# Patient Record
Sex: Female | Born: 1950 | Race: White | Hispanic: No | Marital: Married | Smoking: Former smoker
Health system: Southern US, Community
[De-identification: ages and names within clinical notes are randomized; demographics above are authoritative.]

## PROBLEM LIST (undated history)

## (undated) DIAGNOSIS — M542 Cervicalgia: Secondary | ICD-10-CM

## (undated) DIAGNOSIS — I10 Essential (primary) hypertension: Secondary | ICD-10-CM

## (undated) DIAGNOSIS — K589 Irritable bowel syndrome without diarrhea: Secondary | ICD-10-CM

## (undated) DIAGNOSIS — M1990 Unspecified osteoarthritis, unspecified site: Secondary | ICD-10-CM

## (undated) HISTORY — PX: BREAST EXCISIONAL BIOPSY: SUR124

## (undated) HISTORY — DX: Essential (primary) hypertension: I10

## (undated) HISTORY — DX: Cervicalgia: M54.2

## (undated) HISTORY — PX: BREAST BIOPSY: SHX20

## (undated) HISTORY — DX: Unspecified osteoarthritis, unspecified site: M19.90

## (undated) HISTORY — DX: Irritable bowel syndrome, unspecified: K58.9

---

## 1998-04-10 ENCOUNTER — Other Ambulatory Visit: Admission: RE | Admit: 1998-04-10 | Discharge: 1998-04-10 | Payer: Self-pay | Admitting: Obstetrics & Gynecology

## 1999-12-08 ENCOUNTER — Encounter: Admission: RE | Admit: 1999-12-08 | Discharge: 1999-12-08 | Payer: Self-pay | Admitting: Obstetrics & Gynecology

## 1999-12-08 ENCOUNTER — Encounter: Payer: Self-pay | Admitting: Obstetrics & Gynecology

## 2001-07-26 ENCOUNTER — Encounter: Payer: Self-pay | Admitting: Obstetrics & Gynecology

## 2001-07-26 ENCOUNTER — Encounter: Admission: RE | Admit: 2001-07-26 | Discharge: 2001-07-26 | Payer: Self-pay | Admitting: Obstetrics & Gynecology

## 2001-08-17 ENCOUNTER — Other Ambulatory Visit: Admission: RE | Admit: 2001-08-17 | Discharge: 2001-08-17 | Payer: Self-pay | Admitting: Obstetrics & Gynecology

## 2004-08-27 ENCOUNTER — Ambulatory Visit: Payer: Self-pay | Admitting: Adult Health

## 2004-11-18 ENCOUNTER — Ambulatory Visit: Payer: Self-pay | Admitting: Pulmonary Disease

## 2004-12-25 ENCOUNTER — Ambulatory Visit: Payer: Self-pay | Admitting: Pulmonary Disease

## 2005-06-24 ENCOUNTER — Ambulatory Visit: Payer: Self-pay | Admitting: Pulmonary Disease

## 2005-07-15 ENCOUNTER — Ambulatory Visit: Payer: Self-pay | Admitting: Pulmonary Disease

## 2005-08-26 ENCOUNTER — Ambulatory Visit: Payer: Self-pay | Admitting: Pulmonary Disease

## 2005-10-26 ENCOUNTER — Other Ambulatory Visit: Admission: RE | Admit: 2005-10-26 | Discharge: 2005-10-26 | Payer: Self-pay | Admitting: Obstetrics & Gynecology

## 2006-07-13 ENCOUNTER — Ambulatory Visit: Payer: Self-pay | Admitting: Pulmonary Disease

## 2007-01-17 ENCOUNTER — Ambulatory Visit: Payer: Self-pay | Admitting: Pulmonary Disease

## 2007-01-17 LAB — CONVERTED CEMR LAB
ALT: 14 units/L (ref 0–40)
AST: 23 units/L (ref 0–37)
Albumin: 4.2 g/dL (ref 3.5–5.2)
Alkaline Phosphatase: 47 units/L (ref 39–117)
BUN: 14 mg/dL (ref 6–23)
Basophils Absolute: 0 10*3/uL (ref 0.0–0.1)
Basophils Relative: 0.4 % (ref 0.0–1.0)
Bilirubin, Direct: 0.1 mg/dL (ref 0.0–0.3)
CO2: 33 meq/L — ABNORMAL HIGH (ref 19–32)
Calcium: 9.3 mg/dL (ref 8.4–10.5)
Chloride: 103 meq/L (ref 96–112)
Creatinine, Ser: 0.8 mg/dL (ref 0.4–1.2)
Eosinophils Absolute: 0.2 10*3/uL (ref 0.0–0.6)
Eosinophils Relative: 2.1 % (ref 0.0–5.0)
GFR calc Af Amer: 95 mL/min
GFR calc non Af Amer: 79 mL/min
Glucose, Bld: 88 mg/dL (ref 70–99)
HCT: 38.9 % (ref 36.0–46.0)
Hemoglobin: 13.5 g/dL (ref 12.0–15.0)
Lymphocytes Relative: 42 % (ref 12.0–46.0)
MCHC: 34.6 g/dL (ref 30.0–36.0)
MCV: 92.7 fL (ref 78.0–100.0)
Monocytes Absolute: 0.4 10*3/uL (ref 0.2–0.7)
Monocytes Relative: 5.5 % (ref 3.0–11.0)
Neutro Abs: 3.6 10*3/uL (ref 1.4–7.7)
Neutrophils Relative %: 50 % (ref 43.0–77.0)
Platelets: 227 10*3/uL (ref 150–400)
Potassium: 3.9 meq/L (ref 3.5–5.1)
RBC: 4.2 M/uL (ref 3.87–5.11)
RDW: 12.2 % (ref 11.5–14.6)
Sed Rate: 11 mm/hr (ref 0–25)
Sodium: 140 meq/L (ref 135–145)
TSH: 1 microintl units/mL (ref 0.35–5.50)
Total Bilirubin: 0.9 mg/dL (ref 0.3–1.2)
Total Protein: 7.2 g/dL (ref 6.0–8.3)
WBC: 7.2 10*3/uL (ref 4.5–10.5)

## 2007-03-24 ENCOUNTER — Encounter: Admission: RE | Admit: 2007-03-24 | Discharge: 2007-03-24 | Payer: Self-pay | Admitting: Orthopaedic Surgery

## 2007-07-26 ENCOUNTER — Ambulatory Visit: Payer: Self-pay | Admitting: Pulmonary Disease

## 2007-08-02 ENCOUNTER — Ambulatory Visit: Payer: Self-pay | Admitting: Pulmonary Disease

## 2007-08-02 DIAGNOSIS — I10 Essential (primary) hypertension: Secondary | ICD-10-CM | POA: Insufficient documentation

## 2007-08-02 DIAGNOSIS — J309 Allergic rhinitis, unspecified: Secondary | ICD-10-CM | POA: Insufficient documentation

## 2007-09-22 HISTORY — PX: OTHER SURGICAL HISTORY: SHX169

## 2007-12-27 ENCOUNTER — Ambulatory Visit: Payer: Self-pay | Admitting: Pulmonary Disease

## 2008-02-16 ENCOUNTER — Telehealth (INDEPENDENT_AMBULATORY_CARE_PROVIDER_SITE_OTHER): Payer: Self-pay | Admitting: *Deleted

## 2008-02-20 ENCOUNTER — Ambulatory Visit: Payer: Self-pay | Admitting: Pulmonary Disease

## 2008-04-11 ENCOUNTER — Ambulatory Visit: Payer: Self-pay | Admitting: Pulmonary Disease

## 2008-04-11 DIAGNOSIS — M1990 Unspecified osteoarthritis, unspecified site: Secondary | ICD-10-CM | POA: Insufficient documentation

## 2008-04-29 DIAGNOSIS — J45909 Unspecified asthma, uncomplicated: Secondary | ICD-10-CM | POA: Insufficient documentation

## 2008-04-29 DIAGNOSIS — M542 Cervicalgia: Secondary | ICD-10-CM | POA: Insufficient documentation

## 2008-06-04 ENCOUNTER — Telehealth: Payer: Self-pay | Admitting: Pulmonary Disease

## 2008-06-13 ENCOUNTER — Ambulatory Visit: Payer: Self-pay | Admitting: Pulmonary Disease

## 2008-08-02 ENCOUNTER — Ambulatory Visit: Payer: Self-pay | Admitting: Pulmonary Disease

## 2008-10-31 ENCOUNTER — Ambulatory Visit: Payer: Self-pay | Admitting: Pulmonary Disease

## 2008-10-31 DIAGNOSIS — J019 Acute sinusitis, unspecified: Secondary | ICD-10-CM | POA: Insufficient documentation

## 2009-01-11 ENCOUNTER — Ambulatory Visit: Payer: Self-pay | Admitting: Pulmonary Disease

## 2009-01-17 ENCOUNTER — Ambulatory Visit: Payer: Self-pay | Admitting: Pulmonary Disease

## 2009-02-27 ENCOUNTER — Telehealth: Payer: Self-pay | Admitting: Pulmonary Disease

## 2009-03-05 ENCOUNTER — Ambulatory Visit: Payer: Self-pay | Admitting: Pulmonary Disease

## 2009-03-26 ENCOUNTER — Telehealth: Payer: Self-pay | Admitting: Pulmonary Disease

## 2009-03-26 LAB — CONVERTED CEMR LAB
Albumin: 4.1 g/dL (ref 3.5–5.2)
Basophils Absolute: 0 10*3/uL (ref 0.0–0.1)
Basophils Relative: 0.7 % (ref 0.0–3.0)
Bilirubin, Direct: 0.1 mg/dL (ref 0.0–0.3)
Chloride: 107 meq/L (ref 96–112)
Cholesterol: 199 mg/dL (ref 0–200)
Eosinophils Absolute: 0.2 10*3/uL (ref 0.0–0.7)
GFR calc non Af Amer: 78.13 mL/min (ref >60)
HDL: 70.3 mg/dL (ref >39.00)
MCHC: 35.1 g/dL (ref 30.0–36.0)
MCV: 92.7 fL (ref 78.0–100.0)
Monocytes Absolute: 0.4 10*3/uL (ref 0.1–1.0)
Neutrophils Relative %: 38.8 % — ABNORMAL LOW (ref 43.0–77.0)
Potassium: 3.9 meq/L (ref 3.5–5.1)
RBC: 4.13 M/uL (ref 3.87–5.11)
RDW: 12.6 % (ref 11.5–14.6)
Sodium: 143 meq/L (ref 135–145)
Total Protein: 7.3 g/dL (ref 6.0–8.3)
Triglycerides: 77 mg/dL (ref 0.0–149.0)
VLDL: 15.4 mg/dL (ref 0.0–40.0)

## 2009-04-04 ENCOUNTER — Telehealth: Payer: Self-pay | Admitting: Pulmonary Disease

## 2009-04-23 ENCOUNTER — Ambulatory Visit: Payer: Self-pay | Admitting: Pulmonary Disease

## 2009-04-23 DIAGNOSIS — R1084 Generalized abdominal pain: Secondary | ICD-10-CM | POA: Insufficient documentation

## 2009-04-24 ENCOUNTER — Encounter: Admission: RE | Admit: 2009-04-24 | Discharge: 2009-04-24 | Payer: Self-pay | Admitting: Pulmonary Disease

## 2009-05-10 ENCOUNTER — Encounter: Payer: Self-pay | Admitting: Adult Health

## 2009-05-20 ENCOUNTER — Encounter: Payer: Self-pay | Admitting: Adult Health

## 2009-06-24 ENCOUNTER — Encounter (INDEPENDENT_AMBULATORY_CARE_PROVIDER_SITE_OTHER): Payer: Self-pay | Admitting: *Deleted

## 2009-06-28 ENCOUNTER — Telehealth: Payer: Self-pay | Admitting: Pulmonary Disease

## 2009-08-20 ENCOUNTER — Encounter: Payer: Self-pay | Admitting: Adult Health

## 2009-08-20 ENCOUNTER — Encounter: Payer: Self-pay | Admitting: Pulmonary Disease

## 2009-08-29 ENCOUNTER — Encounter: Payer: Self-pay | Admitting: Adult Health

## 2009-09-16 ENCOUNTER — Encounter: Payer: Self-pay | Admitting: Pulmonary Disease

## 2009-09-16 ENCOUNTER — Encounter: Payer: Self-pay | Admitting: Adult Health

## 2009-09-26 ENCOUNTER — Ambulatory Visit: Payer: Self-pay | Admitting: Pulmonary Disease

## 2009-10-10 ENCOUNTER — Ambulatory Visit: Payer: Self-pay | Admitting: Pulmonary Disease

## 2009-10-10 DIAGNOSIS — K589 Irritable bowel syndrome without diarrhea: Secondary | ICD-10-CM | POA: Insufficient documentation

## 2009-12-10 ENCOUNTER — Ambulatory Visit: Payer: Self-pay | Admitting: Pulmonary Disease

## 2009-12-28 ENCOUNTER — Encounter: Payer: Self-pay | Admitting: Pulmonary Disease

## 2010-01-06 ENCOUNTER — Encounter: Payer: Self-pay | Admitting: Pulmonary Disease

## 2010-02-25 ENCOUNTER — Telehealth (INDEPENDENT_AMBULATORY_CARE_PROVIDER_SITE_OTHER): Payer: Self-pay | Admitting: *Deleted

## 2010-02-27 ENCOUNTER — Encounter: Payer: Self-pay | Admitting: Pulmonary Disease

## 2010-06-03 ENCOUNTER — Emergency Department (HOSPITAL_BASED_OUTPATIENT_CLINIC_OR_DEPARTMENT_OTHER)
Admission: EM | Admit: 2010-06-03 | Discharge: 2010-06-03 | Payer: Self-pay | Source: Home / Self Care | Admitting: Emergency Medicine

## 2010-06-03 ENCOUNTER — Ambulatory Visit: Payer: Self-pay | Admitting: Diagnostic Radiology

## 2010-07-28 ENCOUNTER — Ambulatory Visit: Payer: Self-pay | Admitting: Internal Medicine

## 2010-10-21 NOTE — Letter (Signed)
Summary: [facility]   Imported By: Sherian Rein 01/20/2010 08:27:20  _____________________________________________________________________  External Attachment:    Type:   Image     Comment:   External Document

## 2010-10-21 NOTE — Assessment & Plan Note (Signed)
Summary: Pulmonary/ acute   ov prob  sinusitis   Primary Provider/Referring Provider:  Kriste Basque  CC:  Sore throat and HA/congestion.  History of Present Illness: 59 yowf remote smoker  HTN, Asthma, and rhinitis     October 31, 2008--Complains of 2 weeks of nasal congestion, sinus pain, pressure, drainage, worse over last 5 day. Had migraine HA 5 days. Resolved that day, then today headache is back today. b/p was ok at work. Tender along cheeks, teeth are painful, tender glands, hoarse, sore throat.   January 11, 2009--Presents for an acute office visit. Complains onset 12-16-08, went to [place] for Easter.  Thick pollen.  doing nasal spray, zyrtec, taking decongestant occ.  increased fatigue, head congestion, ears stopped up, runny nose, sore throat. Not getting better. Very stopped up, go clear to yellow.   April 23, 2009--Presents for acute office visit. pain in right side under rib area.  states this aggravated with "anything she eats or drinks."  nausea/burping/bloating although her BM's are regular.  states pain occasionally radiates to the back or the left side. x10days. Started  after eating corn.  and popcorn.    09/26/09--Presents for persistent abdominal symtpoms. Complains of intermittent "stomach attacks  over last 4-5 months.  Seen last visit. tx w/ PPI and diet changes. Abd [place] was unremarkable. She did not improve and was seen by GI in [facility]. She underwent CT abd/pelvis w/ no acute findings, HIDA scan w/ nml GB fxn. Attacks come on anytime, mid epigastric and last for few hours, no vomitting. Mild nausea, worse after food. Now over last 4 weeks has sore spot along back that is sore to touch, some pain with bending. no rash, no urinary symptoms. UDIP in office is neg.     October 10, 2009--Presents for 2 week follow up - states the pain had completely resolved since stopping the dexilant and beginning the levsin, but the improvement has dropped to about 75% since taking the last of  the levsin  Back pain totally resolved with skelaxin and advil.  rec gas x / levsin as needed > worked better than anything else  see page 2  July 28, 2010 acute  ov  cc  sore throat and HA/congestion with L sinus pain x one week, acute in onset > gold colored mucus and L max pain and toothache .  no sob. Pt denies any significant  dysphagia, itching, sneezing,  nasal congestion or excess secretions,  fever, chills, sweats, unintended wt loss, pleuritic or exertional cp, hempoptysis, change in activity tolerance  orthopnea pnd or leg swelling   Current Medications (verified): 1)  Zyrtec Allergy 10 Mg Tabs (Cetirizine Hcl) .... As Needed 2)  Nasacort Aq 55 Mcg/act Aers (Triamcinolone Acetonide(Nasal)) .... 2 Puffs Two Times A Day As Needed 3)  Ventolin Hfa 108 (90 Base) Mcg/act Aers (Albuterol Sulfate) .... 2 Sprays Every 4-6 Hours As Needed For Wheezing 4)  Flovent Hfa 220 Mcg/act Aero (Fluticasone Propionate  Hfa) .... 2 Puffs Two Times A Day As Needed 5)  Singulair 10 Mg Tabs (Montelukast Sodium) .... Take 1 Tab By Mouth Once Daily.Marland KitchenMarland Kitchen 6)  Tenoretic 50 50-25 Mg  Tabs (Atenolol-Chlorthalidone) .... Take 1 Tablet By Mouth Once A Day 7)  Estrace 1 Mg Tabs (Estradiol) .... 1/2 Tab By Mouth Once Daily 8)  Mucinex Dm 30-600 Mg Xr12h-Tab (Dextromethorphan-Guaifenesin) .Marland Kitchen.. 1 To 2 Two Times A Day As Needed  Allergies (verified): 1)  ! Penicillin 2)  ! Sulfa 3)  ! *  Lorabid 4)  ! Codeine  Past History:  Past Medical History: Reviewed history from 12/10/2009 and no changes required.  ALLERGIC RHINITIS (ICD-477.9) ASTHMA, INTERMITTENT, MILD (ICD-493.90) HYPERTENSION (ICD-401.9) IRRITABLE BOWEL SYNDROME (ICD-564.1) DEGENERATIVE JOINT DISEASE (ICD-715.90) NECK PAIN (ICD-723.1)  Vital Signs:  Patient profile:   60 year old female Weight:      137 pounds O2 Sat:      95 % on Room air Temp:     98.2 degrees F oral Pulse rate:   70 / minute BP sitting:   110 / 64  (left arm)  Vitals  Entered By: Vernie Murders (July 28, 2010 9:43 AM)  O2 Flow:  Room air  Physical Exam  Additional Exam:  amb wf nad wt  139 > 137 July 28, 2010  GENERAL:  Alert & oriented; pleasant & cooperative... HEENT:  [place]/AT,   EACs-pale, clear discharge,  TMs-wnl, NOSE-red swollen, max tenderness. on L , THROAT-clear & wnl. NECK:  Supple w/ fairROM; no JVD; normal carotid impulses w/o bruits; no thyromegaly or nodules palpated; no lymphadenopathy. CHEST:  Clear to P & A; without wheezes/ rales/ or rhonchi heard... HEART:  Regular Rhythm; without murmurs/ rubs/ or gallops detected... ABDOMEN:  Soft & nontender; normal bowel sounds; no organomegaly or masses palpated..  EXT: without deformities,  no varicose veins/ venous insuffic/ or edema.     Impression & Recommendations:  Problem # 1:  SINUSITIS, ACUTE (ICD-461.9)   ? sinusitis in pt with multiple abx  intol and underlying rhinitis ? allergic   See instructions for specific recommendations re use of afrin to help open the sinus ostia  Medications Added to Medication List This Visit: 1)  Nasacort Aq 55 Mcg/act Aers (Triamcinolone acetonide(nasal)) .... 2 puffs two times a day as needed 2)  Flovent Hfa 220 Mcg/act Aero (Fluticasone propionate  hfa) .... 2 puffs two times a day as needed 3)  Mucinex Dm 30-600 Mg Xr12h-tab (Dextromethorphan-guaifenesin) .Marland Kitchen.. 1 to 2 two times a day as needed 4)  Zithromax Z-pak 250 Mg Tabs (Azithromycin) .... 2 on first day and one daily x 4 days  Other Orders: Est. Patient Level III (84132)  Patient Instructions: 1)  I emphasized that nasal steroids (nasacort) have no immediate benefit in terms of improving symptoms.  To help them reached the target tissue, the patient should use Afrin two puffs every 12 hours applied one min before using the nasal steroids.  Afrin should be stopped after no more than 5 days.  If the symptoms worsen, Afrin can be restarted after 5 days off of therapy to prevent rebound  congestion from overuse of Afrin.  I also emphasized that in no way are nasal steroids a concern in terms of "addiction". 2)  Zpac should work but is relatively mild and if still nasty mucus after complete the prescription please call  3)    Prescriptions: ZITHROMAX Z-PAK 250 MG TABS (AZITHROMYCIN) 2 on first day and one daily x 4 days  #1 pak x 0   Entered and Authorized by:   Nyoka Cowden MD   Signed by:   Nyoka Cowden MD on 07/28/2010   Method used:   Electronically to        [facility] [phone]* (retail)       [address]       [place], [place]  [address]       Ph: [phone]       Fax: [phone]  RxID:   [id number]

## 2010-10-21 NOTE — Medication Information (Signed)
Summary: [facility]   Imported By: Lehman Prom 02/27/2010 14:25:32  _____________________________________________________________________  External Attachment:    Type:   Image     Comment:   External Document

## 2010-10-21 NOTE — Letter (Signed)
Summary: [facility]   Imported By: Sherian Rein 10/08/2009 07:17:08  _____________________________________________________________________  External Attachment:    Type:   Image     Comment:   External Document

## 2010-10-21 NOTE — Letter (Signed)
Summary: [facility]  [facility]   Imported By: Sherian Rein 10/08/2009 07:15:30  _____________________________________________________________________  External Attachment:    Type:   Image     Comment:   External Document

## 2010-10-21 NOTE — Assessment & Plan Note (Signed)
Summary: NP follow up - ABD pain   CC:  2 week follow up - states the pain had completely resolved since stopping the dexilant and beginning the levsin and but the improvement has dropped to about 75% since taking the last of the levsin on Monday.  History of Present Illness: 60 year old female with known history of HTN, Asthma, and rhinitis     October 31, 2008--Complains of 2 weeks of nasal congestion, sinus pain, pressure, drainage, worse over last 5 day. Had migraine HA 5 days. Resolved that day, then today headache is back today. b/p was ok at work. Tender along cheeks, teeth are painful, tender glands, hoarse, sore throat.   January 11, 2009--Presents for an acute office visit. Complains onset 12-16-08, went to [place] for Easter.  Thick pollen.  doing nasal spray, zyrtec, taking decongestant occ.  increased fatigue, head congestion, ears stopped up, runny nose, sore throat. Not getting better. Very stopped up, go clear to yellow.   April 23, 2009--Presents for acute office visit. pain in right side under rib area.  states this aggravated with "anything she eats or drinks."  nausea/burping/bloating although her BM's are regular.  states pain occasionally radiates to the back or the left side. x10days. Started  after eating corn.  and popcorn.    09/26/09--Presents for persistent abdominal symtpoms. Complains of intermittent "stomach attacks  over last 4-5 months.  Seen last visit. tx w/ PPI and diet changes. Abd [place] was unremarkable. She did not improve and was seen by GI in [facility]. She underwent CT abd/pelvis w/ no acute findings, HIDA scan w/ nml GB fxn. Attacks come on anytime, mid epigastric and last for few hours, no vomitting. Mild nausea, worse after food. Now over last 4 weeks has sore spot along back that is sore to touch, some pain with bending. no rash, no urinary symptoms. UDIP in office is neg.     October 10, 2009--Presents for 2 week follow up - states the pain had completely  resolved since stopping the dexilant and beginning the levsin, but the improvement has dropped to about 75% since taking the last of the levsin on Monday. Back pain totally resolved with skelaxin and advil. She is feeling so much better. Denies chest pain, dyspnea, orthopnea, hemoptysis, fever, n/v/d, edema,urinary symptoms   Medications Prior to Update: 1)  Nasacort Aq 55 Mcg/act Aers (Triamcinolone Acetonide(Nasal)) .... 2 Puffs Two Times A Day 2)  Proair Hfa 108 (90 Base) Mcg/act Aers (Albuterol Sulfate) .... 2 Sprays Every 4-6 H As Needed For Wheezing... 3)  Flovent Hfa 220 Mcg/act Aero (Fluticasone Propionate  Hfa) .... 2 Puffs Two Times A Day 4)  Singulair 10 Mg Tabs (Montelukast Sodium) .... Take 1 Tab By Mouth Once Daily.Marland KitchenMarland Kitchen 5)  Tenoretic 50 50-25 Mg  Tabs (Atenolol-Chlorthalidone) .... Take 1 Tablet By Mouth Once A Day 6)  Estrace 1 Mg Tabs (Estradiol) .... 1/2 Tab By Mouth Once Daily 7)  Darvocet-N 100 100-650 Mg  Tabs (Propoxyphene N-Apap) .... Take 1 Tab By Mouth Q 6 H As Needed For Pain... 8)  Ondansetron 4 Mg Tbdp (Ondansetron) .Marland Kitchen.. 1 By Mouth Every 4-6 Hrs As Needed Nausea 9)  Skelaxin 800 Mg Tabs (Metaxalone) .Marland Kitchen.. 1 By Mouth Three Times A Day As Needed Muscle Spasm 10)  Levsin 0.125 Mg Tabs (Hyoscyamine Sulfate) .Marland Kitchen.. 1 By Mouth Three Times A Day As Needed Abdominal Pain  Current Medications (verified): 1)  Nasacort Aq 55 Mcg/act Aers (Triamcinolone Acetonide(Nasal)) .... 2  Puffs Two Times A Day 2)  Proair Hfa 108 (90 Base) Mcg/act Aers (Albuterol Sulfate) .... 2 Sprays Every 4-6 H As Needed For Wheezing... 3)  Flovent Hfa 220 Mcg/act Aero (Fluticasone Propionate  Hfa) .... 2 Puffs Two Times A Day 4)  Singulair 10 Mg Tabs (Montelukast Sodium) .... Take 1 Tab By Mouth Once Daily.Marland KitchenMarland Kitchen 5)  Tenoretic 50 50-25 Mg  Tabs (Atenolol-Chlorthalidone) .... Take 1 Tablet By Mouth Once A Day 6)  Estrace 1 Mg Tabs (Estradiol) .... 1/2 Tab By Mouth Once Daily 7)  Darvocet-N 100 100-650 Mg  Tabs  (Propoxyphene N-Apap) .... Take 1 Tab By Mouth Q 6 H As Needed For Pain... 8)  Ondansetron 4 Mg Tbdp (Ondansetron) .Marland Kitchen.. 1 By Mouth Every 4-6 Hrs As Needed Nausea 9)  Skelaxin 800 Mg Tabs (Metaxalone) .Marland Kitchen.. 1 By Mouth Three Times A Day As Needed Muscle Spasm 10)  Levsin 0.125 Mg Tabs (Hyoscyamine Sulfate) .Marland Kitchen.. 1 By Mouth Three Times A Day As Needed Abdominal Pain  Allergies (verified): 1)  ! Penicillin 2)  ! Sulfa 3)  ! * Lorabid  Past History:  Past Medical History: Last updated: Feb 04, 2009 SINUSITIS, ACUTE (ICD-461.9) ALLERGIC RHINITIS (ICD-477.9) ASTHMA, INTERMITTENT, MILD (ICD-493.90) HYPERTENSION (ICD-401.9) DEGENERATIVE JOINT DISEASE (ICD-715.90) NECK PAIN (ICD-723.1)  Past Surgical History: Last updated: 02/04/09 S/P left shoulder surgery by DrCraig [place] in 2009  Family History: Last updated: 02-04-09 Mother died age 46 from brain aneurysm Father died age 7 from lung cancer 4 siblings 1 brother died age 66 from brain aneurysm 1 brother alive age 71 1 sister alive age 105 1 brother alive age 46  Social History: Last updated: 04/11/2008 married 1 child no etoh quit smoking in 1974  Risk Factors: Smoking Status: quit (08/02/2007)  Review of Systems      See HPI  Vital Signs:  Patient profile:   60 year old female Height:      64 inches Weight:      136 pounds BMI:     23.43 O2 Sat:      98 % on Room air Temp:     98.3 degrees F oral Pulse rate:   66 / minute BP sitting:   106 / 66  (right arm) Cuff size:   regular  Vitals Entered By: Boone Master CNA (October 10, 2009 9:56 AM)  O2 Flow:  Room air  Physical Exam  Additional Exam:  WD, WN, 60y/o WF in NAD... GENERAL:  Alert & oriented; pleasant & cooperative... HEENT:  [place]/AT,   EACs-pale, clear discharge,  TMs-wnl, NOSE-red swollen, max tenderness. , THROAT-clear & wnl. NECK:  Supple w/ fairROM; no JVD; normal carotid impulses w/o bruits; no thyromegaly or nodules palpated; no  lymphadenopathy. CHEST:  Clear to P & A; without wheezes/ rales/ or rhonchi heard... HEART:  Regular Rhythm; without murmurs/ rubs/ or gallops detected... ABDOMEN:  Soft & nontender; normal bowel sounds; no organomegaly or masses palpated..  EXT: without deformities,  no varicose veins/ venous insuffic/ or edema.     Impression & Recommendations:  Problem # 1:  IRRITABLE BOWEL SYNDROME (ICD-564.1)  Suspect her symptoms were IBS  She is much improved with dietary changes and levsin/gasx  rec to add lactose back in slowly and evaluate if symptoms return could have component of lactose intolerance as well.  follow up Dr. Kriste Basque for physical in couple of months and as needed   Orders: Est. Patient Level II (30865)  Complete Medication List: 1)  Nasacort Aq 55 Mcg/act Aers (  Triamcinolone acetonide(nasal)) .... 2 puffs two times a day 2)  Proair Hfa 108 (90 Base) Mcg/act Aers (Albuterol sulfate) .... 2 sprays every 4-6 h as needed for wheezing... 3)  Flovent Hfa 220 Mcg/act Aero (Fluticasone propionate  hfa) .... 2 puffs two times a day 4)  Singulair 10 Mg Tabs (Montelukast sodium) .... Take 1 tab by mouth once daily.Marland KitchenMarland Kitchen 5)  Tenoretic 50 50-25 Mg Tabs (Atenolol-chlorthalidone) .... Take 1 tablet by mouth once a day 6)  Estrace 1 Mg Tabs (Estradiol) .... 1/2 tab by mouth once daily 7)  Darvocet-n 100 100-650 Mg Tabs (Propoxyphene n-apap) .... Take 1 tab by mouth q 6 h as needed for pain.Marland KitchenMarland Kitchen 8)  Ondansetron 4 Mg Tbdp (Ondansetron) .Marland Kitchen.. 1 by mouth every 4-6 hrs as needed nausea 9)  Skelaxin 800 Mg Tabs (Metaxalone) .Marland Kitchen.. 1 by mouth three times a day as needed muscle spasm 10)  Levsin 0.125 Mg Tabs (Hyoscyamine sulfate) .Marland Kitchen.. 1 by mouth three times a day as needed abdominal pain  Patient Instructions: 1)  Add lactose slowly back in diet.  2)  Gas x w/ meals.  3)  Levsin three times a day as needed abdominal pain/bloating.  4)  Warm heat to back  5)   Please contact office for sooner follow up  if symptoms do not improve or worsen  6)  follow up Dr. Kriste Basque for physical and as needed  Prescriptions: LEVSIN 0.125 MG TABS (HYOSCYAMINE SULFATE) 1 by mouth three times a day as needed abdominal pain  #60 x 5   Entered and Authorized by:   Rubye Oaks NP   Signed by:   Rubye Oaks NP on 10/10/2009   Method used:   Electronically to        [facility] [phone]* (retail)       [address] [place], [place]  [address]       Ph: [phone]       Fax: [phone]   RxID:   [id number]

## 2010-10-21 NOTE — Progress Notes (Signed)
Summary: prior auth for singlair--pending  Phone Note Other Incoming   Summary of Call: [facility] in [place] for prior auth on singulair 10mg    1 by mouth once daily ---called [phone]--[facility] for prior auth.  forms have been faxed to the office--will get SN to sign and fax back for approval--case # [id number]. Randell Loop CMA  February 25, 2010 5:05 PM      Appended Document: prior auth for singlair--pending hold for approval of the prior auth

## 2010-10-21 NOTE — Assessment & Plan Note (Signed)
Summary: cpx/jd   CC:  Yearly CPX....  History of Present Illness: 59 y/o WF here for a follow up visit...  she has multiple medical problems as noted below...     ~  January 17, 2009:  c/o incr allergy symptoms this spring- saw nurse practitioner 1 week ago w/ ZPak, Pred, Saline added to her regular Allegra, Accolate, Flovent, Proair... the Pred really helped and she is much better... insurance company requesting change from Accolate to Singulair to save her money- OK.   ~  December 10, 2009:  evaluated by TP 1/11 for abd pain, & GI= DrAustin in [place]>> neg AbdSonar, then CT Abd- neg, NAD; & subseq HIDA also neg- norm GB...  had EGD 11/10 DrAustin> sm HH, mild gastitis, neg HPylori... CT Abd 12/10 showed sm liver cysts only... she improved w/ Levsin for prob IBS... also had LBP- given Skelaxin & improved...    Current Problem List:  ALLERGIC RHINITIS (ICD-477.9) - on ZYRTEK OTC & NASONEX as needed... hx of incr IgE level = 289 in 1997... testing by DrSharma + for dust, molds, cat- she refused immunotherapy...  ~  4/10: incr allergy symptoms w/ Pred Rx and improved...  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 weaned off FLOVENT 220- hasn't needed in >34yr (without current asthma problems)...   ~  CXR 3/11 showed clear lungs, NAD...  HYPERTENSION (ICD-401.9) - on TENORETIC 50-25 daily...  BP well controlled now = 110/68 today and similar at home she says... denies HA, fatigue, visual changes, CP, palipit, dizziness, syncope, dyspnea, edema, etc...  ~  EKG 3/11 showed NSR, WNL.Marland KitchenMarland Kitchen  IRRITABLE BOWEL SYNDROME (ICD-564.1) - w/u for abd pain by DrAustin (GI in [place]) was neg in 2010 ** see above **  ~  she needs routine screening colonoscopy & will contact DrAustin for this procedure.  DEGENERATIVE JOINT DISEASE (ICD-715.90) & NECK PAIN (ICD-723.1) - he DJD w/ neck pain and left shoulder pain eval & Rx by Ortho in [place], DrO'Brian & DrCraig (also seen by  DrWainer 2008)... 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 she is s/p shot in neck and shoulder... she finally had left shoulder surgery by DrCraig in [place] (frozen shoulder) w/ post-op PT and now much improved- she states neck feels better too- doing well since surg.  Health Maintenance - s/p PNEUMOVAX 11/08 (age 65, Dx=asthma), s/p TETANUS shot 6/09, GYN= DrWein...  ~  last FLP here 9/04 showed TChol 203, TG 60, HDL 61, LDL 130...  ~  f/u FLP 6/10 showed TChol 199, TG 77, HDL 70, LDL 113... continue diet Rx.   Allergies: 1)  ! Penicillin 2)  ! Sulfa 3)  ! * Lorabid 4)  ! Codeine  Comments:  Nurse/Medical Assistant: The patient's medications and allergies were reviewed with the patient and were updated in the Medication and Allergy Lists.  Past History:  Past Medical History:  ALLERGIC RHINITIS (ICD-477.9) ASTHMA, INTERMITTENT, MILD (ICD-493.90) HYPERTENSION (ICD-401.9) IRRITABLE BOWEL SYNDROME (ICD-564.1) DEGENERATIVE JOINT DISEASE (ICD-715.90) NECK PAIN (ICD-723.1)  Past Surgical History: S/P left shoulder surgery by DrCraig in [place] in 2009  Family History: Reviewed history from 01/17/2009 and no changes required. Mother died age 96 from brain aneurysm Father died age 13 from lung cancer 4 siblings 1 brother died age 41 from brain aneurysm 1 brother alive age 67 1 sister alive age 3 1 brother alive age 65  Social History: Reviewed history from 04/11/2008 and no changes  required. married 1 child no etoh quit smoking in 1974  Review of Systems       The patient complains of nasal congestion.  The patient denies fever, chills, sweats, anorexia, fatigue, weakness, malaise, weight loss, sleep disorder, blurring, diplopia, eye irritation, eye discharge, vision loss, eye pain, photophobia, earache, ear discharge, tinnitus, decreased hearing, nosebleeds, sore throat, hoarseness, chest pain, palpitations, syncope, dyspnea on exertion, orthopnea, PND,  peripheral edema, cough, dyspnea at rest, excessive sputum, hemoptysis, wheezing, pleurisy, nausea, vomiting, diarrhea, constipation, change in bowel habits, abdominal pain, melena, hematochezia, jaundice, gas/bloating, indigestion/heartburn, dysphagia, odynophagia, dysuria, hematuria, urinary frequency, urinary hesitancy, nocturia, incontinence, back pain, joint pain, joint swelling, muscle cramps, muscle weakness, stiffness, arthritis, sciatica, restless legs, leg pain at night, leg pain with exertion, rash, itching, dryness, suspicious lesions, paralysis, paresthesias, seizures, tremors, vertigo, transient blindness, frequent falls, frequent headaches, difficulty walking, depression, anxiety, memory loss, confusion, cold intolerance, heat intolerance, polydipsia, polyphagia, polyuria, unusual weight change, abnormal bruising, bleeding, enlarged lymph nodes, urticaria, allergic rash, hay fever, and recurrent infections.         She is doing satis at present & anxiously awaiting the spring pollen...  Vital Signs:  Patient profile:   60 year old female Height:      64 inches Weight:      139.25 pounds BMI:     23.99 O2 Sat:      98 % on Room air Temp:     98.0 degrees F oral Pulse rate:   70 / minute BP sitting:   110 / 68  (right arm) Cuff size:   regular  Vitals Entered By: Randell Loop CMA (December 10, 2009 2:18 PM)  O2 Sat at Rest %:  98 O2 Flow:  Room air CC: Yearly CPX... Is Patient Diabetic? No Pain Assessment Patient in pain? no      Comments MEDS UPDATED TODAY   Physical Exam  Additional Exam:  WD, WN, 60 y/o WF in NAD... GENERAL:  Alert & oriented; pleasant & cooperative... HEENT:  [place]/AT, EOM- full, PERRLA, EACs-pale, clear discharge,  TMs-wnl, NOSE- sl congested, THROAT-clear & wnl. NECK:  Supple w/ fairROM; no JVD; normal carotid impulses w/o bruits; no thyromegaly or nodules palpated; no lymphadenopathy. CHEST:  Clear to P & A; without wheezes/ rales/ or rhonchi  heard... HEART:  Regular Rhythm; without murmurs/ rubs/ or gallops detected... ABDOMEN:  Soft & nontender; normal bowel sounds; no organomegaly or masses palpated..  EXT: without deformities,  no varicose veins/ venous insuffic/ or edema. NEURO:  CN's intact; no focal neuro deficits... DERM: neg- no lesions seen...    CXR  Procedure date:  12/10/2009  Findings:      CHEST - 2 VIEW Comparison: Chest 01/17/2007.   Findings: The lungs are clear.  Heart size is normal.  No pleural effusion or focal bony abnormality.   IMPRESSION: No acute disease.   Read By:  Charyl Dancer,  M.D.    EKG  Procedure date:  12/10/2009  Findings:      Normal sinus rhythm with rate of:  62/min... Tracing is WNL, NAD...  SN   Impression & Recommendations:  Problem # 1:  PHYSICAL EXAMINATION (ICD-V70.0)  Orders: EKG w/ Interpretation (93000) T-2 View CXR (71020TC) LABS from 6/10 reviewed w/ pt- she declined repeat studies.  Problem # 2:  ASTHMA, INTERMITTENT, MILD (ICD-493.90) Hx mild intermittent asthma & allergies... discussed strategies w/ pt... Her updated medication list for this problem includes:    Ventolin Hfa 108 (90 Base)  Mcg/act Aers (Albuterol sulfate) .Marland Kitchen... 2 sprays every 4-6 hours as needed for wheezing    Flovent Hfa 220 Mcg/act Aero (Fluticasone propionate  hfa) .Marland Kitchen... 2 puffs two times a day    Singulair 10 Mg Tabs (Montelukast sodium) .Marland Kitchen... Take 1 tab by mouth once daily...  Problem # 3:  HYPERTENSION (ICD-401.9) Controlled on Tenoretic... tol well... continue same. Her updated medication list for this problem includes:    Tenoretic 50 50-25 Mg Tabs (Atenolol-chlorthalidone) .Marland Kitchen... Take 1 tablet by mouth once a day  Problem # 4:  IRRITABLE BOWEL SYNDROME (ICD-564.1) As noted in HPI- eval by GI- DrAustin w/ prob IBS, Rx Levsin & improved... She will call him for needed screening colon...  Problem # 5:  DEGENERATIVE JOINT DISEASE (ICD-715.90) Aware-  she sees  Ortho in [place]... The following medications were removed from the medication list:    Darvocet-n 100 100-650 Mg Tabs (Propoxyphene n-apap) .Marland Kitchen... Take 1 tab by mouth q 6 h as needed for pain...  Problem # 6:  OTHER MEDICAL PROBLEMS AS NOTED>>>  Complete Medication List: 1)  Zyrtec Allergy 10 Mg Tabs (Cetirizine hcl) .... As needed 2)  Nasacort Aq 55 Mcg/act Aers (Triamcinolone acetonide(nasal)) .... 2 puffs two times a day 3)  Ventolin Hfa 108 (90 Base) Mcg/act Aers (Albuterol sulfate) .... 2 sprays every 4-6 hours as needed for wheezing 4)  Flovent Hfa 220 Mcg/act Aero (Fluticasone propionate  hfa) .... 2 puffs two times a day 5)  Singulair 10 Mg Tabs (Montelukast sodium) .... Take 1 tab by mouth once daily.Marland KitchenMarland Kitchen 6)  Tenoretic 50 50-25 Mg Tabs (Atenolol-chlorthalidone) .... Take 1 tablet by mouth once a day 7)  Levsin 0.125 Mg Tabs (Hyoscyamine sulfate) .Marland Kitchen.. 1 by mouth three times a day as needed abdominal pain 8)  Estrace 1 Mg Tabs (Estradiol) .... 1/2 tab by mouth once daily  Other Orders: Prescription Created Electronically [phone])  Patient Instructions: 1)  Today we updated your med list- see below.... 2)  We refilled your meds per request... 3)  Today we did your follow up CXR & EKG... please call the "phone tree" in a few days for your results.Marland KitchenMarland Kitchen 4)  Continue your excellent job w/ diet + exercise... 5)  Call for any problems.Marland KitchenMarland Kitchen 6)  Please schedule a follow-up appointment in 1 year. Prescriptions: SINGULAIR 10 MG TABS (MONTELUKAST SODIUM) take 1 tab by mouth once daily...  #30 x prn   Entered and Authorized by:   Michele Mcalpine MD   Signed by:   Michele Mcalpine MD on 12/10/2009   Method used:   Print then Give to Patient   RxID:   [id number] TENORETIC 50 50-25 MG  TABS (ATENOLOL-CHLORTHALIDONE) Take 1 tablet by mouth once a day  #30 x prn   Entered and Authorized by:   Michele Mcalpine MD   Signed by:   Michele Mcalpine MD on 12/10/2009   Method used:   Print then Give to Patient    RxID:   [id number]    CardioPerfect ECG  ID: [id number] Patient: Karen Shepard DOB: 1951/06/17 Age: 60 Years Old Sex: Female Race: White Physician: Solash Tullo Technician: Randell Loop CMA Height: 64 Weight: 139.25 Status: Unconfirmed Past Medical History:  SINUSITIS, ACUTE (ICD-461.9) ALLERGIC RHINITIS (ICD-477.9) ASTHMA, INTERMITTENT, MILD (ICD-493.90) HYPERTENSION (ICD-401.9) DEGENERATIVE JOINT DISEASE (ICD-715.90) NECK PAIN (ICD-723.1)   Recorded: 12/10/2009 2:39 PM P/PR: 112 ms / 135 ms - Heart rate (maximum exercise) QRS: 93 QT/QTc/QTd: 413 ms / 418 ms /  93 ms - Heart rate (maximum exercise)  P/QRS/T axis: 45 deg / 65 deg / 55 deg - Heart rate (maximum exercise)  Heartrate: 63 bpm  Interpretation:  Normal sinus rhythm with rate of:  62/min... Tracing is WNL, NAD...  SN

## 2010-10-21 NOTE — Procedures (Signed)
Summary: Upper GI Endoscopy/[facility]  Upper GI Endoscopy/[facility]   Imported By: Sherian Rein 10/08/2009 07:13:02  _____________________________________________________________________  External Attachment:    Type:   Image     Comment:   External Document

## 2010-10-21 NOTE — Letter (Signed)
Summary: [facility]   Imported By: Lester Partridge 10/11/2009 09:46:40  _____________________________________________________________________  External Attachment:    Type:   Image     Comment:   External Document

## 2010-10-21 NOTE — Assessment & Plan Note (Signed)
Summary: Acute NP office visit - upper GI pain   CC:  was diagnosed w/ "a simple liver cyst" on 08-20-09 to explain her upper right quadrant GI pain that now radiates to the right kidney area that pt describes as a dull ache and the pain is reportedly different from last OV in 04-2009.  pt was given dexilant for the bloating/burping that increases her BMs to 2-3x daily. Marland Kitchen  History of Present Illness: 60 year old female with known history of HTN, Asthma, and rhinitis     October 31, 2008--Complains of 2 weeks of nasal congestion, sinus pain, pressure, drainage, worse over last 5 day. Had migraine HA 5 days. Resolved that day, then today headache is back today. b/p was ok at work. Tender along cheeks, teeth are painful, tender glands, hoarse, sore throat.   January 11, 2009--Presents for an acute office visit. Complains onset 12-16-08, went to [place] for Easter.  Thick pollen.  doing nasal spray, zyrtec, taking decongestant occ.  increased fatigue, head congestion, ears stopped up, runny nose, sore throat. Not getting better. Very stopped up, go clear to yellow.   April 23, 2009--Presents for acute office visit. pain in right side under rib area.  states this aggravated with "anything she eats or drinks."  nausea/burping/bloating although her BM's are regular.  states pain occasionally radiates to the back or the left side. x10days. Started  after eating corn.  and popcorn.    09/26/09--Presents for persistent abdominal symtpoms. Complains of intermittent "stomach attacks  over last 4-5 months.  Seen last visit. tx w/ PPI and diet changes. Abd [place] was unremarkable. She did not improve and was seen by GI in [facility]. She underwent CT abd/pelvis w/ no acute findings, HIDA scan w/ nml GB fxn. Attacks come on anytime, mid epigastric and last for few hours, no vomitting. Mild nausea, worse after food. Now over last 4 weeks has sore spot along back that is sore to touch, some pain with bending. no rash, no  urinary symptoms. UDIP in office is neg.  Denies chest pain, dyspnea, orthopnea, hemoptysis, fever,  edema, headache, bloody stools, urinary symtpoms.  Notes reviewed from GI. No help with Dexilant-stool loose now.        Medications Prior to Update: 1)  Nasacort Aq 55 Mcg/act Aers (Triamcinolone Acetonide(Nasal)) .... 2 Puffs Two Times A Day 2)  Proair Hfa 108 (90 Base) Mcg/act Aers (Albuterol Sulfate) .... 2 Sprays Every 4-6 H As Needed For Wheezing... 3)  Flovent Hfa 220 Mcg/act Aero (Fluticasone Propionate  Hfa) .... 2 Puffs Two Times A Day 4)  Singulair 10 Mg Tabs (Montelukast Sodium) .... Take 1 Tab By Mouth Once Daily.Marland KitchenMarland Kitchen 5)  Tenoretic 50 50-25 Mg  Tabs (Atenolol-Chlorthalidone) .... Take 1 Tablet By Mouth Once A Day 6)  Estrace 1 Mg Tabs (Estradiol) .... 1/2 Tab By Mouth Once Daily 7)  Darvocet-N 100 100-650 Mg  Tabs (Propoxyphene N-Apap) .... Take 1 Tab By Mouth Q 6 H As Needed For Pain... 8)  Ondansetron 4 Mg Tbdp (Ondansetron) .Marland Kitchen.. 1 By Mouth Every 4-6 Hrs As Needed Nausea  Current Medications (verified): 1)  Nasacort Aq 55 Mcg/act Aers (Triamcinolone Acetonide(Nasal)) .... 2 Puffs Two Times A Day 2)  Proair Hfa 108 (90 Base) Mcg/act Aers (Albuterol Sulfate) .... 2 Sprays Every 4-6 H As Needed For Wheezing... 3)  Flovent Hfa 220 Mcg/act Aero (Fluticasone Propionate  Hfa) .... 2 Puffs Two Times A Day 4)  Singulair 10 Mg Tabs (Montelukast Sodium) .Marland KitchenMarland KitchenMarland Kitchen  Take 1 Tab By Mouth Once Daily.Marland KitchenMarland Kitchen 5)  Tenoretic 50 50-25 Mg  Tabs (Atenolol-Chlorthalidone) .... Take 1 Tablet By Mouth Once A Day 6)  Estrace 1 Mg Tabs (Estradiol) .... 1/2 Tab By Mouth Once Daily 7)  Darvocet-N 100 100-650 Mg  Tabs (Propoxyphene N-Apap) .... Take 1 Tab By Mouth Q 6 H As Needed For Pain... 8)  Ondansetron 4 Mg Tbdp (Ondansetron) .Marland Kitchen.. 1 By Mouth Every 4-6 Hrs As Needed Nausea  Allergies (verified): 1)  ! Penicillin 2)  ! Sulfa 3)  ! * Lorabid  Past History:  Past Medical History: Last updated:  Feb 14, 2009 SINUSITIS, ACUTE (ICD-461.9) ALLERGIC RHINITIS (ICD-477.9) ASTHMA, INTERMITTENT, MILD (ICD-493.90) HYPERTENSION (ICD-401.9) DEGENERATIVE JOINT DISEASE (ICD-715.90) NECK PAIN (ICD-723.1)  Past Surgical History: Last updated: 02/14/2009 S/P left shoulder surgery [place] DrCraig [place] in 2009  Family History: Last updated: 2009/02/14 Mother died age 51 from brain aneurysm Father died age 62 from lung cancer 4 siblings 1 brother died age 60 from brain aneurysm 1 brother alive age 14 1 sister alive age 64 1 brother alive age 12  Social History: Last updated: 04/11/2008 married 1 child no etoh quit smoking in 1974  Risk Factors: Smoking Status: quit (08/02/2007)  Review of Systems      See HPI  Vital Signs:  Patient profile:   60 year old female Height:      64 inches Weight:      136 pounds BMI:     23.43 O2 Sat:      98 % on Room air Temp:     98.1 degrees F oral Pulse rate:   66 / minute BP sitting:   122 / 76  (left arm) Cuff size:   regular  Vitals Entered By: Boone Master CNA (September 26, 2009 4:27 PM)  O2 Flow:  Room air CC: was diagnosed w/ "a simple liver cyst" on 08-20-09 to explain her upper right quadrant GI pain that now radiates to the right kidney area that pt describes as a dull ache and the pain is reportedly different from last OV in 04-2009.  pt was given dexilant for the bloating/burping that increases her BMs to 2-3x daily.  Is Patient Diabetic? No Comments Medications reviewed with patient Daytime contact number verified with patient. Boone Master CNA  September 26, 2009 4:27 PM    Physical Exam  Additional Exam:  WD, WN, 60y/o WF in NAD... GENERAL:  Alert & oriented; pleasant & cooperative... HEENT:  [place]/AT,   EACs-pale, clear discharge,  TMs-wnl, NOSE-red swollen, max tenderness. , THROAT-clear & wnl. NECK:  Supple w/ fairROM; no JVD; normal carotid impulses w/o bruits; no thyromegaly or nodules palpated; no lymphadenopathy. CHEST:   Clear to P & A; without wheezes/ rales/ or rhonchi heard... HEART:  Regular Rhythm; without murmurs/ rubs/ or gallops detected... ABDOMEN:  Soft & nontender; normal bowel sounds; no organomegaly or masses palpated...neg murphy's sign, neg CVA tenderness along left mid low back tender to touch, no redness/eccymosis or rash noted, neg SLRs, nml gait.  EXT: without deformities,  no varicose veins/ venous insuffic/ or edema.     Impression & Recommendations:  Problem # 1:  ABDOMINAL PAIN, GENERALIZED (ICD-789.07)  ? Etiology , possible IBS, if not improving will  need to See GI back. She has had an extensive workup  we discussed all the findings . This may be diet related. Advised on diet .  REC:  Stop Dexilant, No lactose, Gas x w/ meals. , Levsin three  times a day as needed abdominal pain/bloating.   Orders: Est. Patient Level IV (16109)  Problem # 2:  DEGENERATIVE JOINT DISEASE (ICD-715.90)  ?back strain REC:  Warm heat to back  Advil 200mg  3 tabs two times a day with food for 7 days Skelaxin 800mg  three times a day as needed for muscle spasm  Please contact office for sooner follow up if symptoms do not improve or worsen  follow up 2-3 weeks   Her updated medication list for this problem includes:    Darvocet-n 100 100-650 Mg Tabs (Propoxyphene n-apap) .Marland Kitchen... Take 1 tab by mouth q 6 h as needed for pain...  Orders: Est. Patient Level IV (60454)  Medications Added to Medication List This Visit: 1)  Skelaxin 800 Mg Tabs (Metaxalone) .Marland Kitchen.. 1 by mouth three times a day as needed muscle spasm 2)  Levsin 0.125 Mg Tabs (Hyoscyamine sulfate) .Marland Kitchen.. 1 by mouth three times a day as needed abdominal pain  Complete Medication List: 1)  Nasacort Aq 55 Mcg/act Aers (Triamcinolone acetonide(nasal)) .... 2 puffs two times a day 2)  Proair Hfa 108 (90 Base) Mcg/act Aers (Albuterol sulfate) .... 2 sprays every 4-6 h as needed for wheezing... 3)  Flovent Hfa 220 Mcg/act Aero (Fluticasone  propionate  hfa) .... 2 puffs two times a day 4)  Singulair 10 Mg Tabs (Montelukast sodium) .... Take 1 tab by mouth once daily.Marland KitchenMarland Kitchen 5)  Tenoretic 50 50-25 Mg Tabs (Atenolol-chlorthalidone) .... Take 1 tablet by mouth once a day 6)  Estrace 1 Mg Tabs (Estradiol) .... 1/2 tab by mouth once daily 7)  Darvocet-n 100 100-650 Mg Tabs (Propoxyphene n-apap) .... Take 1 tab by mouth q 6 h as needed for pain.Marland KitchenMarland Kitchen 8)  Ondansetron 4 Mg Tbdp (Ondansetron) .Marland Kitchen.. 1 by mouth every 4-6 hrs as needed nausea 9)  Skelaxin 800 Mg Tabs (Metaxalone) .Marland Kitchen.. 1 by mouth three times a day as needed muscle spasm 10)  Levsin 0.125 Mg Tabs (Hyoscyamine sulfate) .Marland Kitchen.. 1 by mouth three times a day as needed abdominal pain  Patient Instructions: 1)  Stop Dexilant 2)  Warm heat to back.  3)  No lactose 4)  Gas x w/ meals.  5)  Levsin three times a day as needed abdominal pain/bloating.  6)  Warm heat to back  7)  Advil 200mg  3 tabs two times a day with food for 7 days 8)  Skelaxin 800mg  three times a day as needed for muscle spasm  9)  Please contact office for sooner follow up if symptoms do not improve or worsen  10)  follow up 2-3 weeks  Prescriptions: LEVSIN 0.125 MG TABS (HYOSCYAMINE SULFATE) 1 by mouth three times a day as needed abdominal pain  #30 x 0   Entered and Authorized by:   Rubye Oaks NP   Signed by:   Rubye Oaks NP on 09/26/2009   Method used:   Electronically to        [facility] [phone]* (retail)       [address] [place], [place]  [address]       Ph: [phone]       Fax: [phone]   RxID:   [id number] SKELAXIN 800 MG TABS (METAXALONE) 1 by mouth three times a day as needed muscle spasm  #30 x 0   Entered and Authorized by:   Rubye Oaks NP   Signed by:   Tammy Parrett NP on 09/26/2009   Method used:  Electronically to        [facility] [phone]* (retail)       [address] [place], [place]  [address]       Ph: [phone]       Fax: [phone]   RxID:    [id number]    Immunization History:  Influenza Immunization History:    Influenza:  historical (06/21/2009)  Pneumovax Immunization History:    Pneumovax:  historical (05/22/2006)

## 2010-12-08 ENCOUNTER — Ambulatory Visit: Payer: Self-pay | Admitting: Pulmonary Disease

## 2010-12-17 ENCOUNTER — Telehealth: Payer: Self-pay | Admitting: Pulmonary Disease

## 2010-12-17 MED ORDER — MONTELUKAST SODIUM 10 MG PO TABS: 10.0000 mg | ORAL_TABLET | Freq: Every day | ORAL | Status: DC

## 2010-12-17 MED ORDER — ALBUTEROL SULFATE HFA 108 (90 BASE) MCG/ACT IN AERS: 2.0000 | INHALATION_SPRAY | RESPIRATORY_TRACT | Status: DC | PRN

## 2010-12-17 MED ORDER — ATENOLOL-CHLORTHALIDONE 50-25 MG PO TABS: 1.0000 | ORAL_TABLET | Freq: Every day | ORAL | Status: DC

## 2010-12-17 NOTE — Telephone Encounter (Signed)
Called spoke with patient who verified her request for refills on ventolin, singulair and atenolol chlor 50-25mg .  Pt has upcoming appt for cpx on 4.12.12 w/ SN.  Refills sent to pt's verified pharmacy and pt is aware to keep this appt.

## 2010-12-19 ENCOUNTER — Telehealth: Payer: Self-pay | Admitting: Pulmonary Disease

## 2010-12-19 NOTE — Telephone Encounter (Signed)
Called and spoke with pt and she is ok with changing her appt---cancelled the one for 4-12 and rescheduled for 4-3 at 11:30

## 2010-12-23 ENCOUNTER — Ambulatory Visit (INDEPENDENT_AMBULATORY_CARE_PROVIDER_SITE_OTHER)
Admission: RE | Admit: 2010-12-23 | Discharge: 2010-12-23 | Disposition: A | Discharge status: Home / Self Care | Payer: BC Managed Care – PPO | Source: Ambulatory Visit | Attending: Pulmonary Disease | Admitting: Pulmonary Disease

## 2010-12-23 ENCOUNTER — Other Ambulatory Visit (INDEPENDENT_AMBULATORY_CARE_PROVIDER_SITE_OTHER): Payer: BC Managed Care – PPO

## 2010-12-23 ENCOUNTER — Encounter: Payer: Self-pay | Admitting: Pulmonary Disease

## 2010-12-23 ENCOUNTER — Other Ambulatory Visit (INDEPENDENT_AMBULATORY_CARE_PROVIDER_SITE_OTHER): Payer: BC Managed Care – PPO | Admitting: Pulmonary Disease

## 2010-12-23 ENCOUNTER — Ambulatory Visit (INDEPENDENT_AMBULATORY_CARE_PROVIDER_SITE_OTHER): Payer: BC Managed Care – PPO | Admitting: Pulmonary Disease

## 2010-12-23 VITALS — BP 128/72 | HR 67 | Temp 97.4°F | Ht 64.0 in | Wt 141.2 lb

## 2010-12-23 DIAGNOSIS — M1990 Unspecified osteoarthritis, unspecified site: Secondary | ICD-10-CM

## 2010-12-23 DIAGNOSIS — Z0000 Encounter for general adult medical examination without abnormal findings: Secondary | ICD-10-CM

## 2010-12-23 DIAGNOSIS — I10 Essential (primary) hypertension: Secondary | ICD-10-CM

## 2010-12-23 DIAGNOSIS — E785 Hyperlipidemia, unspecified: Secondary | ICD-10-CM

## 2010-12-23 DIAGNOSIS — J45909 Unspecified asthma, uncomplicated: Secondary | ICD-10-CM

## 2010-12-23 DIAGNOSIS — K589 Irritable bowel syndrome without diarrhea: Secondary | ICD-10-CM

## 2010-12-23 DIAGNOSIS — J309 Allergic rhinitis, unspecified: Secondary | ICD-10-CM

## 2010-12-23 LAB — URINALYSIS
Leukocytes, UA: NEGATIVE
Nitrite: NEGATIVE
Specific Gravity, Urine: 1.01 (ref 1.000–1.030)
Total Protein, Urine: NEGATIVE
pH: 7 (ref 5.0–8.0)

## 2010-12-23 LAB — CBC WITH DIFFERENTIAL/PLATELET
Basophils Absolute: 0 10*3/uL (ref 0.0–0.1)
Eosinophils Absolute: 0.2 10*3/uL (ref 0.0–0.7)
Hemoglobin: 14 g/dL (ref 12.0–15.0)
Lymphocytes Relative: 45.2 % (ref 12.0–46.0)
Monocytes Relative: 5.8 % (ref 3.0–12.0)
Neutrophils Relative %: 45.2 % (ref 43.0–77.0)
Platelets: 237 10*3/uL (ref 150.0–400.0)
RDW: 13.2 % (ref 11.5–14.6)

## 2010-12-23 MED ORDER — MONTELUKAST SODIUM 10 MG PO TABS: 10.0000 mg | ORAL_TABLET | Freq: Every day | ORAL | Status: DC

## 2010-12-23 MED ORDER — ATENOLOL-CHLORTHALIDONE 50-25 MG PO TABS: 1.0000 | ORAL_TABLET | Freq: Every day | ORAL | Status: DC

## 2010-12-23 MED ORDER — OLOPATADINE HCL 0.6 % NA SOLN: NASAL | Status: DC

## 2010-12-23 MED ORDER — ALBUTEROL SULFATE HFA 108 (90 BASE) MCG/ACT IN AERS: 2.0000 | INHALATION_SPRAY | Freq: Four times a day (QID) | RESPIRATORY_TRACT | Status: DC | PRN

## 2010-12-23 MED ORDER — OLOPATADINE HCL 0.2 % OP SOLN: OPHTHALMIC | Status: DC

## 2010-12-23 MED ORDER — ALPRAZOLAM 0.5 MG PO TABS: ORAL_TABLET | ORAL | Status: DC

## 2010-12-23 NOTE — Progress Notes (Signed)
Subjective:    Patient ID: Karen [place], female    DOB: 09/15/1951, 60 y.o.   MRN: [id number]  HPI 60 y/o WF here for a follow up visit...  she has multiple medical problems including:  AR & Asthma;  HBP;  Hx small [place]/ Gastitis;  IBS;  DJD & neck pain;  Stress at work...  ~  December 10, 2009:  evaluated by TP 1/11 for abd pain, & GI= Karen [place]>> neg AbdSonar, then CT Abd- neg, NAD; & subseq HIDA also neg- norm GB...  had EGD 11/10 Karen [place], mild gastitis, neg HPylori... CT Abd 12/10 showed sm liver cysts only... she improved w/ Levsin for prob IBS... also had LBP- given Skelaxin & improved...  ~  December 23, 2010:  Yearly ROV & c/o prob w/ allergies & her asthma (some HAs & inhaler is too $$);  Stressful situation at school & c/o insomnia- tried TylenolPM but notes mightmares- Rec trial Alprazolam 0.5mg  1/2 to 1 tab po Tid & HS...    AR>  She uses Singulair daily, & Allegra as needed: we wrote Rx for Patanase & Pataday (for itchy eyes) to try for her allergy symptoms...    Asthma>  On the Singulair & uses Albuterol inhaler prn (Proair is least expensive copay)...    HBP>  On Tenoretic daily & BP= 128/72 today & similar at home & school she says;  Continue same Rx- she denies CP, palpit, SOB, edema, etc...    GI>  Hx mild gastritis & has OTC meds for prn use;  Known IBS & intermittent symptoms but hasn't required meds;  Followed by Karen [place] & had Colonoscopy 4/11 w/ one 4mm polyp removed, non-adenoma & f/u suggested 4yrs.    DJD>  She had left shoulder surg in [place] & post op PT which helped;  She takes Ibuprofen as needed...     Health Maintenance - s/p PNEUMOVAX 11/08 (age 55, Dx=asthma), s/p TETANUS shot 6/09, GYN= Karen [place], GI= Karen [place]... ~  f/u FLP today on diet alone showed TChol 212, TG 64, HDL 72, LDL 125> needs better low chol diet if she wants to avoid meds...   Outpatient Encounter Prescriptions as of 12/23/2010  Medication Sig Dispense Refill  . albuterol (VENTOLIN  HFA) 108 (90 BASE) MCG/ACT inhaler Inhale 2 puffs into the lungs every 4 (four) hours as needed.  1 Inhaler  11  . atenolol-chlorthalidone (TENORETIC) 50-25 MG per tablet Take 1 tablet by mouth daily.  30 tablet  11  . estradiol (ESTRACE) 1 MG tablet Take 1/2 tablet daily       . fexofenadine (ALLEGRA) 180 MG tablet Take 180 mg by mouth daily.        . montelukast (SINGULAIR) 10 MG tablet Take 1 tablet (10 mg total) by mouth daily.  30 tablet  11    Allergies  Allergen Reactions  . Codeine     REACTION: itching  . Penicillins     REACTION: causes severe swelling  . Sulfonamide Derivatives     REACTION: swelling    Review of Systems      The patient complains of nasal congestion.  The patient denies fever, chills, sweats, anorexia, fatigue, weakness, malaise, weight loss, sleep disorder, blurring, diplopia, eye irritation, eye discharge, vision loss, eye pain, photophobia, earache, ear discharge, tinnitus, decreased hearing, nosebleeds, sore throat, hoarseness, chest pain, palpitations, syncope, dyspnea on exertion, orthopnea, PND, peripheral edema, cough, dyspnea at rest, excessive sputum, hemoptysis, wheezing, pleurisy, nausea, vomiting,  diarrhea, constipation, change in bowel habits, abdominal pain, melena, hematochezia, jaundice, gas/bloating, indigestion/heartburn, dysphagia, odynophagia, dysuria, hematuria, urinary frequency, urinary hesitancy, nocturia, incontinence, back pain, joint pain, joint swelling, muscle cramps, muscle weakness, stiffness, arthritis, sciatica, restless legs, leg pain at night, leg pain with exertion, rash, itching, dryness, suspicious lesions, paralysis, paresthesias, seizures, tremors, vertigo, transient blindness, frequent falls, frequent headaches, difficulty walking, depression, anxiety, memory loss, confusion, cold intolerance, heat intolerance, polydipsia, polyphagia, polyuria, unusual weight change, abnormal bruising, bleeding, enlarged lymph nodes, urticaria,  allergic rash, hay fever, and recurrent infections.     Objective:   Physical Exam      WD, WN, 60 y/o WF in NAD... GENERAL:  Alert & oriented; pleasant & cooperative... HEENT:  [place]/AT, EOM- full, PERRLA, EACs-pale, clear discharge,  TMs-wnl, NOSE- sl congested, THROAT-clear & wnl. NECK:  Supple w/ fairROM; no JVD; normal carotid impulses w/o bruits; no thyromegaly or nodules palpated; no lymphadenopathy. CHEST:  Clear to P & A; without wheezes/ rales/ or rhonchi heard... HEART:  Regular Rhythm; without murmurs/ rubs/ or gallops detected... ABDOMEN:  Soft & nontender; normal bowel sounds; no organomegaly or masses palpated..  EXT: without deformities,  no varicose veins/ venous insuffic/ or edema. NEURO:  CN's intact; no focal neuro deficits... DERM: neg- no lesions seen...   Assessment & Plan:

## 2010-12-23 NOTE — Patient Instructions (Signed)
We updated your meds today & refilled your Proair, Tenoretic, & Singulair...    We wrote new prescriptions for Pataday eye drops & Patanase nasal spray for your severe allergy symptms...    We also wrote a new prescription for Alprazolam to try for the stress/ anxiety at work> try 1/2 to 1 tab up to 3 times daily as needed (use it at bedtime to help you rest)...  Today we did your follow up CXR, EKG, and blood work...    Please call the [facility] in a few days for your results...    Dial [id number] & when prompted enter your pt number followed by the # symbol...    Your pt number=  [phone]  Call for any questions... Let's plan a follow up eval in 1 yr, sooner as needed.Marland KitchenMarland Kitchen

## 2010-12-24 LAB — HEPATIC FUNCTION PANEL
ALT: 16 U/L (ref 0–35)
AST: 23 U/L (ref 0–37)
Albumin: 4.5 g/dL (ref 3.5–5.2)
Alkaline Phosphatase: 62 U/L (ref 39–117)
Total Protein: 7.8 g/dL (ref 6.0–8.3)

## 2010-12-24 LAB — BASIC METABOLIC PANEL
CO2: 30 mEq/L (ref 19–32)
Calcium: 9.5 mg/dL (ref 8.4–10.5)
Chloride: 100 mEq/L (ref 96–112)
Glucose, Bld: 70 mg/dL (ref 70–99)
Sodium: 142 mEq/L (ref 135–145)

## 2010-12-24 LAB — TSH: TSH: 0.77 u[IU]/mL (ref 0.35–5.50)

## 2010-12-24 LAB — LIPID PANEL: Triglycerides: 64 mg/dL (ref 0.0–149.0)

## 2010-12-27 ENCOUNTER — Encounter: Payer: Self-pay | Admitting: Pulmonary Disease

## 2010-12-27 NOTE — Assessment & Plan Note (Signed)
She is followed by DrAustin in [place] & doing well overall;  Had colonoscopy 7yr ago & 4mm polyp was nonm-adenomatous w/ f/u planned 10 yrs.Marland KitchenMarland Kitchen

## 2010-12-27 NOTE — Assessment & Plan Note (Signed)
Controlled on Tenoretic daily w/ BP= 128/72, tolerated well, no cardiac symptoms.Marland KitchenMarland Kitchen

## 2010-12-27 NOTE — Assessment & Plan Note (Signed)
We discussed sample & Rx for Patanase & Pataday to see if this will carry her thru the allergy season.Marland Kitchen

## 2010-12-27 NOTE — Assessment & Plan Note (Signed)
She uses Ibuprofen as needed & has been stable w/o arthritic exac.Karen KitchenMarland [place]

## 2010-12-27 NOTE — Assessment & Plan Note (Signed)
Asthma stable on prn albut inhaler for her mild intermittent dis.Karen KitchenMarland [place]

## 2011-01-01 ENCOUNTER — Encounter: Payer: Self-pay | Admitting: Pulmonary Disease

## 2011-02-19 ENCOUNTER — Other Ambulatory Visit: Payer: Self-pay | Admitting: Obstetrics & Gynecology

## 2011-02-19 DIAGNOSIS — R928 Other abnormal and inconclusive findings on diagnostic imaging of breast: Secondary | ICD-10-CM

## 2011-02-25 ENCOUNTER — Ambulatory Visit
Admission: RE | Admit: 2011-02-25 | Discharge: 2011-02-25 | Disposition: A | Discharge status: Home / Self Care | Payer: BC Managed Care – PPO | Source: Ambulatory Visit | Attending: Obstetrics & Gynecology | Admitting: Obstetrics & Gynecology

## 2011-02-25 DIAGNOSIS — R928 Other abnormal and inconclusive findings on diagnostic imaging of breast: Secondary | ICD-10-CM

## 2011-08-21 IMAGING — CR DG CHEST 2V
2 series · 2 of 2 positions shown · non-contrast
Comparison: Chest 01/17/2007.

CLINICAL DATA: Physical examination.  Asthma.

CHEST - 2 VIEW

[view not recorded (1 of 2)]
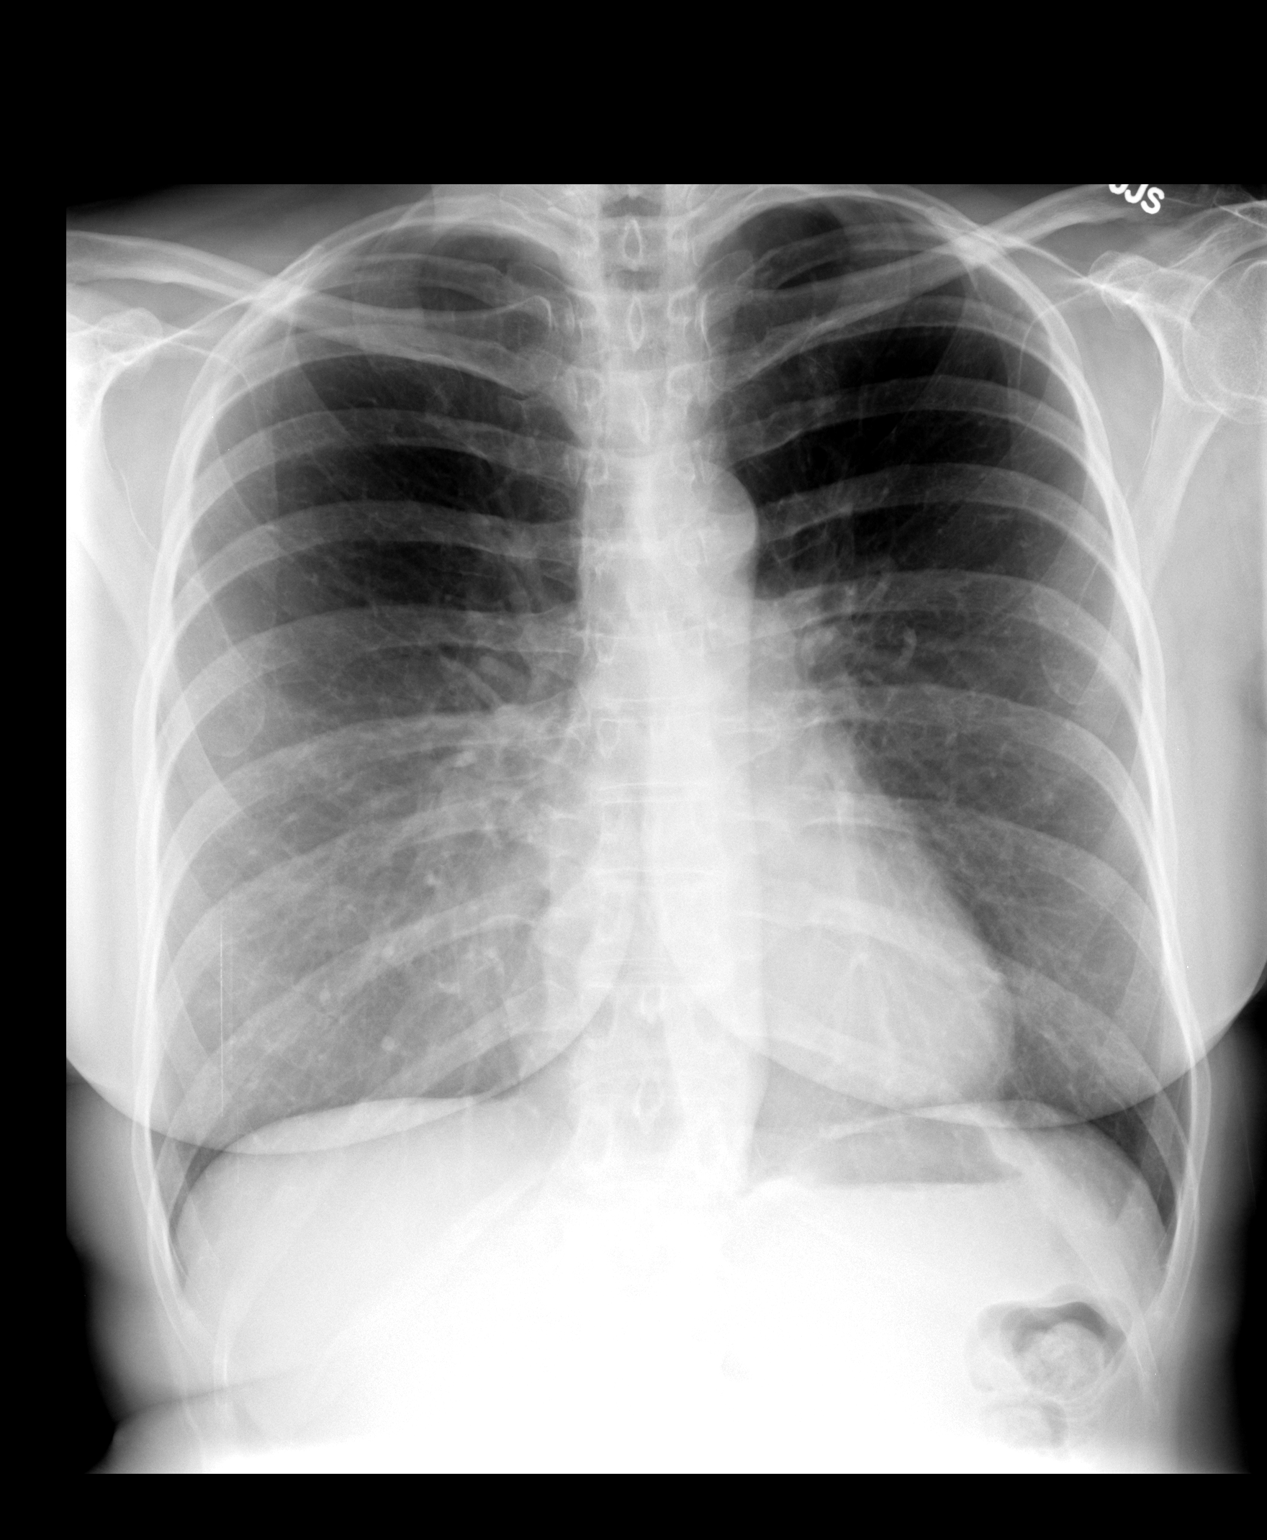

[view not recorded (2 of 2)]
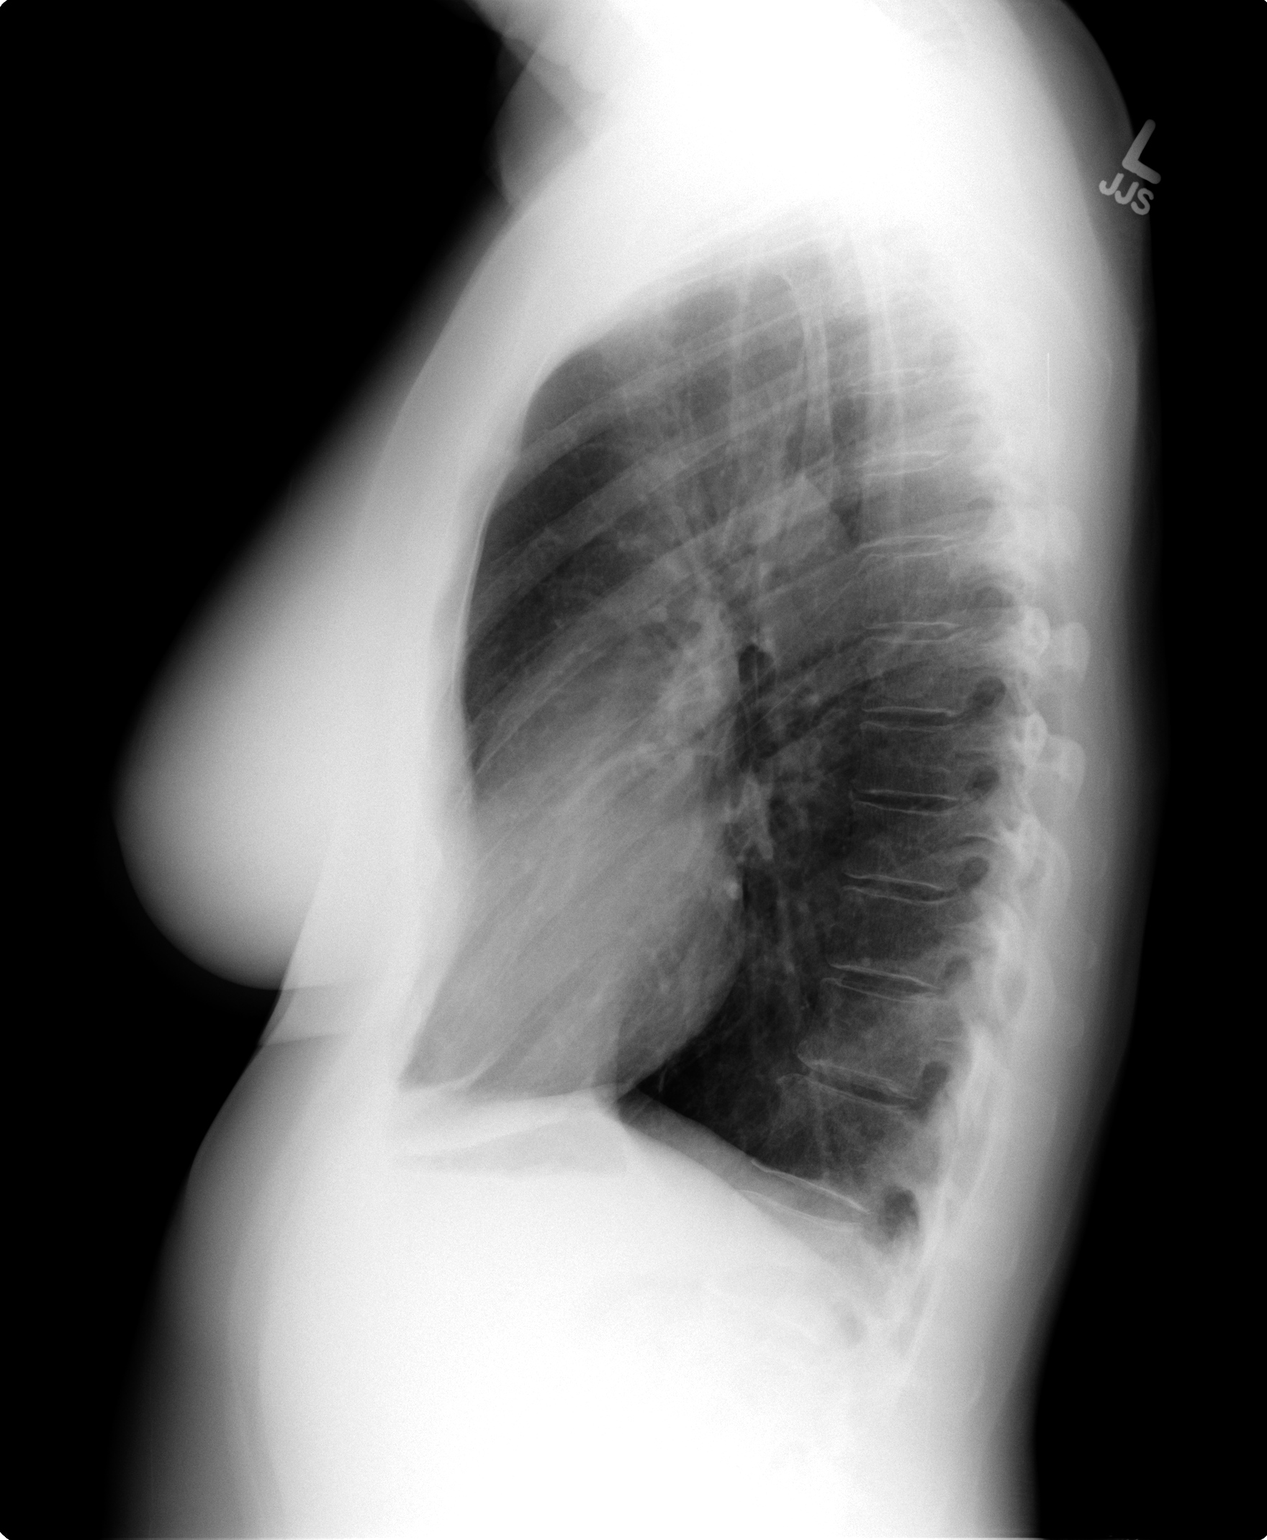

[2 of 2 positions shown; findings below may reference images not displayed]

FINDINGS: The lungs are clear.  Heart size is normal.  No pleural
effusion or focal bony abnormality.
IMPRESSION: No acute disease.

## 2011-11-17 ENCOUNTER — Telehealth: Payer: Self-pay | Admitting: Pulmonary Disease

## 2011-11-17 MED ORDER — AZITHROMYCIN 250 MG PO TABS: ORAL_TABLET | ORAL | Status: AC

## 2011-11-17 NOTE — Telephone Encounter (Signed)
Per SN: can take ZPAK #1 take as directed and  mucinex 2 po BID w/ plenty of fluids.  I spoke with pt and made her aware of this. She voiced her understanding and had no questions. RX has been sent

## 2011-11-17 NOTE — Telephone Encounter (Signed)
Spoke with pt. She is c/o runny nose, sore throat that she relates to PND, chills and aches x 3 days. She states taking zyrtec, saline NS, and afrin without relief. Would like recs from SN. Please advise, thanks! Allergies  Allergen Reactions  . Codeine     REACTION: itching  . Penicillins     REACTION: causes severe swelling  . Sulfonamide Derivatives     REACTION: swelling

## 2011-11-19 ENCOUNTER — Encounter: Payer: Self-pay | Admitting: Adult Health

## 2011-11-19 ENCOUNTER — Ambulatory Visit (INDEPENDENT_AMBULATORY_CARE_PROVIDER_SITE_OTHER): Payer: BC Managed Care – PPO | Admitting: Adult Health

## 2011-11-19 DIAGNOSIS — J111 Influenza due to unidentified influenza virus with other respiratory manifestations: Secondary | ICD-10-CM | POA: Insufficient documentation

## 2011-11-19 MED ORDER — HYDROCODONE-HOMATROPINE 5-1.5 MG/5ML PO SYRP: 5.0000 mL | ORAL_SOLUTION | Freq: Four times a day (QID) | ORAL | Status: AC | PRN

## 2011-11-19 NOTE — Assessment & Plan Note (Signed)
Plan:  Finish Zpack as directed  Mucinex DM Twice daily  As needed  Cough/congestion  Saline nasal rinses As needed   Alternate Tylenol and Advil As needed   Fluids and rest  Hydromet 1-2 tsp every 4-6 hr As needed  Cough  Please contact office for sooner follow up if symptoms do not improve or worsen or seek emergency care  follow up Dr. Kriste Basque  As planned and As needed

## 2011-11-19 NOTE — Progress Notes (Signed)
Subjective:    Patient ID: Karen [place], female    DOB: 03-14-1951, 61 y.o.   MRN: [id number]  HPI  61 y/o WF with known hx of  AR & Asthma;  HBP;  Hx small HH/ Gastitis;  IBS;  DJD & neck pain;  Stress at work...  ~  December 10, 2009:  evaluated by TP 1/11 for abd pain, & GI= DrAustin in [place]>> neg AbdSonar, then CT Abd- neg, NAD; & subseq HIDA also neg- norm GB...  had EGD 11/10 DrAustin> sm HH, mild gastitis, neg HPylori... CT Abd 12/10 showed sm liver cysts only... she improved w/ Levsin for prob IBS... also had LBP- given Skelaxin & improved...  ~  December 23, 2010:  Yearly ROV & c/o prob w/ allergies & her asthma (some HAs & inhaler is too $$);  Stressful situation at school & c/o insomnia- tried TylenolPM but notes mightmares- Rec trial Alprazolam 0.5mg  1/2 to 1 tab po Tid & HS...    AR>  She uses Singulair daily, & Allegra as needed: we wrote Rx for Patanase & Pataday (for itchy eyes) to try for her allergy symptoms...    Asthma>  On the Singulair & uses Albuterol inhaler prn (Proair is least expensive copay)...    HBP>  On Tenoretic daily & BP= 128/72 today & similar at home & school she says;  Continue same Rx- she denies CP, palpit, SOB, edema, etc...    GI>  Hx mild gastritis & has OTC meds for prn use;  Known IBS & intermittent symptoms but hasn't required meds;  Followed by DrAustin in [place] & had Colonoscopy 4/11 w/ one 4mm polyp removed, non-adenoma & f/u suggested 21yrs.    DJD>  She had left shoulder surg in [place] & post op PT which helped;  She takes Ibuprofen as needed...     Health Maintenance - s/p PNEUMOVAX 11/08 (age 28, Dx=asthma), s/p TETANUS shot 6/09, GYN= DrWein, GI= DrAustin... ~  f/u FLP today on diet alone showed TChol 212, TG 64, HDL 72, LDL 125> needs better low chol diet if she wants to avoid meds...   11/19/2011 Acute OV  Complains of fatigue, head congestion, clear nasal drainage, body aches/chills x4days. Was called in a  zpak 2.26.13. She says she developed  severe body aches, chills and fever. Still has body aches and chills. Lots of nasal congestion and drainage. No chest pain or edema. Has been using advil and tylenol . 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   Outpatient Encounter Prescriptions as of 11/19/2011  Medication Sig Dispense Refill  . albuterol (PROAIR HFA) 108 (90 BASE) MCG/ACT inhaler Inhale 2 puffs into the lungs every 6 (six) hours as needed for wheezing.  1 Inhaler  11  . ALPRAZolam (XANAX) 0.5 MG tablet Take 1/2 to 1 tablet by mouth three times daily as needed for nerves  90 tablet  5  . atenolol-chlorthalidone (TENORETIC) 50-25 MG per tablet Take 1 tablet by mouth daily.  30 tablet  11  . azithromycin (ZITHROMAX) 250 MG tablet Take as directed  6 each  0  . estradiol (ESTRACE) 1 MG tablet Take 1/2 tablet daily       . fexofenadine (ALLEGRA) 180 MG tablet Take 180 mg by mouth daily.        . montelukast (SINGULAIR) 10 MG tablet Take 1 tablet (10 mg total) by mouth daily.  30 tablet  11  . Olopatadine HCl (PATADAY) 0.2 % SOLN Use one  drop in each eye daily  1 Bottle  11  . Olopatadine HCl (PATANASE) 0.6 % SOLN Use 1-2 sprays in each nostril two times daily  1 Bottle  11    Allergies  Allergen Reactions  . Codeine     REACTION: itching  . Penicillins     REACTION: causes severe swelling  . Sulfonamide Derivatives     REACTION: swelling    Review of Systems Constitutional:   No  weight loss, night sweats,   +Fevers, chills, + fatigue, or  lassitude.  HEENT:   No headaches,  Difficulty swallowing,  Tooth/dental problems, or  Sore throat,                No sneezing, itching, ear ache,  +nasal congestion, post nasal drip,   CV:  No chest pain,  Orthopnea, PND, swelling in lower extremities, anasarca, dizziness, palpitations, syncope.   GI  No heartburn, indigestion, abdominal pain, nausea, vomiting, diarrhea, change in bowel habits, loss of appetite, bloody stools.   Resp:   No coughing up of blood.  No  change in color of mucus.  No wheezing.  No chest wall deformity  Skin: no rash or lesions.  GU: no dysuria, change in color of urine, no urgency or frequency.  No flank pain, no hematuria   MS:  No joint pain or swelling.  No decreased range of motion.  No back pain.  Psych:  No change in mood or affect. No depression or anxiety.  No memory loss.       Objective:   Physical Exam       WD, WN, 61 y/o WF in NAD... GENERAL:  Alert & oriented; pleasant & cooperative... HEENT:  [place]/AT, EOM- full, PERRLA, EACs-clear  TMs-wnl, NOSE- sl congested, THROAT-clear & wnl. NECK:  Supple w/ fairROM; no JVD; normal carotid impulses w/o bruits; no thyromegaly or nodules palpated; no lymphadenopathy. CHEST:  Clear to P & A; without wheezes/ rales/ or rhonchi heard... HEART:  Regular Rhythm; without murmurs/ rubs/ or gallops detected... ABDOMEN:  Soft & nontender; normal bowel sounds; no organomegaly or masses palpated..  EXT: without deformities,  no varicose veins/ venous insuffic/ or edema. NEURO:   no focal neuro deficits noted  DERM: neg- no lesions seen...   Assessment & Plan:

## 2011-11-19 NOTE — Patient Instructions (Signed)
Finish Zpack as directed  Mucinex DM Twice daily  As needed  Cough/congestion  Saline nasal rinses As needed   Alternate Tylenol and Advil As needed   Fluids and rest  Hydromet 1-2 tsp every 4-6 hr As needed  Cough  Please contact office for sooner follow up if symptoms do not improve or worsen or seek emergency care  follow up Dr. Kriste Basque  As planned and As needed

## 2011-12-24 ENCOUNTER — Ambulatory Visit (INDEPENDENT_AMBULATORY_CARE_PROVIDER_SITE_OTHER): Payer: BC Managed Care – PPO | Admitting: Pulmonary Disease

## 2011-12-24 ENCOUNTER — Encounter: Payer: Self-pay | Admitting: Pulmonary Disease

## 2011-12-24 ENCOUNTER — Other Ambulatory Visit (INDEPENDENT_AMBULATORY_CARE_PROVIDER_SITE_OTHER): Payer: BC Managed Care – PPO

## 2011-12-24 VITALS — BP 126/70 | HR 58 | Temp 96.8°F | Ht 64.0 in | Wt 136.6 lb

## 2011-12-24 DIAGNOSIS — J309 Allergic rhinitis, unspecified: Secondary | ICD-10-CM

## 2011-12-24 DIAGNOSIS — K589 Irritable bowel syndrome without diarrhea: Secondary | ICD-10-CM

## 2011-12-24 DIAGNOSIS — Z0000 Encounter for general adult medical examination without abnormal findings: Secondary | ICD-10-CM

## 2011-12-24 DIAGNOSIS — I10 Essential (primary) hypertension: Secondary | ICD-10-CM

## 2011-12-24 DIAGNOSIS — M1990 Unspecified osteoarthritis, unspecified site: Secondary | ICD-10-CM

## 2011-12-24 DIAGNOSIS — J45909 Unspecified asthma, uncomplicated: Secondary | ICD-10-CM

## 2011-12-24 LAB — BASIC METABOLIC PANEL WITH GFR
BUN: 14 mg/dL (ref 6–23)
CO2: 31 meq/L (ref 19–32)
Calcium: 9.6 mg/dL (ref 8.4–10.5)
Chloride: 99 meq/L (ref 96–112)
Creatinine, Ser: 0.6 mg/dL (ref 0.4–1.2)
GFR: 101.96 mL/min
Glucose, Bld: 85 mg/dL (ref 70–99)
Potassium: 3.6 meq/L (ref 3.5–5.1)
Sodium: 140 meq/L (ref 135–145)

## 2011-12-24 LAB — TSH: TSH: 0.96 u[IU]/mL (ref 0.35–5.50)

## 2011-12-24 LAB — HEPATIC FUNCTION PANEL
Alkaline Phosphatase: 57 U/L (ref 39–117)
Bilirubin, Direct: 0.1 mg/dL (ref 0.0–0.3)
Total Protein: 8.1 g/dL (ref 6.0–8.3)

## 2011-12-24 LAB — LDL CHOLESTEROL, DIRECT: Direct LDL: 121.5 mg/dL

## 2011-12-24 LAB — CBC WITH DIFFERENTIAL/PLATELET
Basophils Absolute: 0.1 10*3/uL (ref 0.0–0.1)
Eosinophils Absolute: 0.1 10*3/uL (ref 0.0–0.7)
HCT: 44.1 % (ref 36.0–46.0)
Hemoglobin: 14.7 g/dL (ref 12.0–15.0)
Lymphocytes Relative: 44.1 % (ref 12.0–46.0)
Lymphs Abs: 2.9 10*3/uL (ref 0.7–4.0)
MCHC: 33.3 g/dL (ref 30.0–36.0)
Neutro Abs: 3 10*3/uL (ref 1.4–7.7)
RDW: 13.5 % (ref 11.5–14.6)

## 2011-12-24 LAB — LIPID PANEL
Total CHOL/HDL Ratio: 3
Triglycerides: 65 mg/dL (ref 0.0–149.0)

## 2011-12-24 MED ORDER — ZOLPIDEM TARTRATE 10 MG PO TABS: ORAL_TABLET | ORAL | Status: DC

## 2011-12-24 MED ORDER — MONTELUKAST SODIUM 10 MG PO TABS: 10.0000 mg | ORAL_TABLET | Freq: Every day | ORAL | Status: DC

## 2011-12-24 MED ORDER — OLOPATADINE HCL 0.6 % NA SOLN: NASAL | Status: DC

## 2011-12-24 MED ORDER — FEXOFENADINE HCL 180 MG PO TABS: 180.0000 mg | ORAL_TABLET | Freq: Every day | ORAL | Status: DC

## 2011-12-24 MED ORDER — ATENOLOL-CHLORTHALIDONE 50-25 MG PO TABS: 1.0000 | ORAL_TABLET | Freq: Every day | ORAL | Status: DC

## 2011-12-24 MED ORDER — ALBUTEROL SULFATE HFA 108 (90 BASE) MCG/ACT IN AERS: 2.0000 | INHALATION_SPRAY | Freq: Four times a day (QID) | RESPIRATORY_TRACT | Status: DC | PRN

## 2011-12-24 NOTE — Patient Instructions (Signed)
Today we updated your med list in our EPIC system...    Continue your current medications the same...    We refilled your meds per request...  We decided to try AMBIEN (generic) 10mg - trying 1/2 to 1 tab as we discussed for sleep...    Let me know how it is working or if we need to consider a different med...  Today we did your follow up FASTING blood work...    We will call you w/ the results when avail...  Call for any problems.Marland KitchenMarland Kitchen

## 2011-12-26 ENCOUNTER — Encounter: Payer: Self-pay | Admitting: Pulmonary Disease

## 2011-12-26 NOTE — Progress Notes (Signed)
Subjective:    Patient ID: Karen Shepard, female    DOB: Feb 08, 1951, 61 y.o.   MRN: [id number]  HPI 61 y/o WF here for a follow up visit...  she has multiple medical problems including:  AR & Asthma;  HBP;  Hx small HH/ Gastitis;  IBS;  DJD & neck pain;  Stress at work...  ~  December 10, 2009:  evaluated by TP 1/11 for abd pain, & GI= DrAustin in [place]>> neg AbdSonar, then CT Abd- neg, NAD; & subseq HIDA also neg- norm GB...  had EGD 11/10 DrAustin> sm HH, mild gastitis, neg HPylori... CT Abd 12/10 showed sm liver cysts only... she improved w/ Levsin for prob IBS... also had LBP- given Skelaxin & improved...  ~  December 23, 2010:  Yearly ROV & c/o prob w/ allergies & her asthma (some HAs & inhaler is too $$);  Stressful situation at school & c/o insomnia- tried TylenolPM but notes mightmares- Rec trial Alprazolam 0.5mg  1/2 to 1 tab po Tid & HS...    AR>  She uses Singulair daily, & Allegra as needed: we wrote Rx for Patanase & Pataday (for itchy eyes) to try for her allergy symptoms...    Asthma>  On the Singulair & uses Albuterol inhaler prn (Proair is least expensive copay)...    HBP>  On Tenoretic daily & BP= 128/72 today & similar at home & school she says;  Continue same Rx- she denies CP, palpit, SOB, edema, etc...    GI>  Hx mild gastritis & has OTC meds for prn use;  Known IBS & intermittent symptoms but hasn't required meds;  Followed by DrAustin in [place] & had Colonoscopy 4/11 w/ one 4mm polyp removed, non-adenoma & f/u suggested 61yrs.    DJD>  She had left shoulder surg in [place] & post op PT which helped;  She takes Ibuprofen as needed...  ~  December 24, 2011:  Yearly ROV & Cindy's CC is insomnia> she falls asleep fairly well but awakens 3-4x per night & has difficulty getting back to sleep, TylenolPM gives her nightmares she says, we discussed trying Melatonin OTC but wrote Rx for Ambien10mg  as well...    AR>  She uses Singulair daily, & Allegra as needed: we wrote Rx for Patanase & Pataday for  prn use as well...    Asthma>  On the Singulair & uses Albuterol inhaler prn (she prefers Ventolin brand)...    HBP>  On Tenoretic 50/25 daily & BP= 126/70 today & similar at home & school she says;  Continue same Rx- she denies CP, palpit, SOB, edema, etc...    Chol> On diet alone & FLP is fair w/ TChol 210, TG 65, HDL 72, LDL 122; she does not want meds & will continue diet & exercise...    GI>  Hx mild gastritis & has OTC meds for prn use; Known IBS & intermittent symptoms but hasn't required meds;  Followed by DrAustin in [place] & had Colonoscopy 4/11 w/ one 4mm polyp removed, non-adenoma & f/u suggested 61yrs.    DJD>  She had left shoulder surg in [place] & post op PT which helped; she stands a lot for her job & arthritis is bothering her; uses Ibuprofen, Aleve, Advil, Tylenol, etc; offered Rx but she feels that OTCs are satid for now... LABS 4/13:  FLP- fair w/ sl elev TChol/LDL but good HDL;  Chems- wnl;  CBC- wnl;  TSH=0.96...   PROBLEM LIST:    ALLERGIC RHINITIS (ICD-477.9) -  on ZYRTEK OTC & NASONEX as needed... hx of incr IgE level = 289 in 1997... testing by DrSharma + for dust, molds, cat- she refused immunotherapy... ~  4/10: incr allergy symptoms w/ Pred Rx and improved...  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 weaned off FLOVENT 220- hasn't needed in >28yr (without current asthma problems)...  ~  CXR 3/11 showed clear lungs, NAD.Marland Kitchen. ~  CXR 4/12 showed sl pectus excavatum deformity, mild scoliosis, clear lungs...  HYPERTENSION (ICD-401.9) - on TENORETIC 50-25 daily...  BP well controlled now = 110/68 today and similar at home she says... denies HA, fatigue, visual changes, CP, palipit, dizziness, syncope, dyspnea, edema, etc... ~  EKG 3/11 showed NSR, WNL...  BORDERLINE CHOLESTEROL >> ~  last FLP here 9/04 showed TChol 203, TG 60, HDL 61, LDL 130... ~  FLP 6/10 showed TChol 199, TG 77, HDL 70, LDL 113... continue diet Rx. ~  FLP  4/12 showed TChol 212, TG 64, HDL 72, LDL 125 ~  FLP 4/13 showed TChol 210, TG 65, HDL 72, LDL 122   IRRITABLE BOWEL SYNDROME (ICD-564.1) - w/u for abd pain by DrAustin (GI in [place]) was neg in 2010 ** see above ** ~  She had screening Colonoscopy 4/11 by DrAustin in [place] and it was neg- one sm polyp w/ benign mucosa on bx, f/u planned 61yrs.  DEGENERATIVE JOINT DISEASE (ICD-715.90) & NECK PAIN (ICD-723.1) - he DJD w/ neck pain and left shoulder pain eval & Rx by Ortho in [place], DrO'Brian & DrCraig (also seen by DrWainer 2008)... 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 she is s/p shot in neck and shoulder... she finally had left shoulder surgery by DrCraig in [place] (frozen shoulder) w/ post-op PT and now much improved- she states neck feels better too- doing well since surg. ~  She uses OTC analgesics for her arthritis> Tylenol, Ibuprofen, Advil, Aleve, etc...  Health Maintenance - s/p PNEUMOVAX 11/08 (age 31, Dx=asthma), s/p TETANUS shot 6/09, & she gets the yearly Flu shot each fall... ~  GI= DrAustin> she had neg screening colon in 4/11 ~  GYN= DrWein> she gets PAP, Mammograms (she had cyst asp in past), BMD (baseline reported neg & done several yrs ago) from him    Outpatient Encounter Prescriptions as of 12/24/2011  Medication Sig Dispense Refill  . albuterol (PROAIR HFA) 108 (90 BASE) MCG/ACT inhaler Inhale 2 puffs into the lungs every 6 (six) hours as needed.  1 Inhaler  11  . atenolol-chlorthalidone (TENORETIC) 50-25 MG per tablet Take 1 tablet by mouth daily.  30 tablet  11  . estradiol (ESTRACE) 1 MG tablet Take 1/2 tablet daily       . fexofenadine (ALLEGRA) 180 MG tablet Take 1 tablet (180 mg total) by mouth daily.  30 tablet  11  . montelukast (SINGULAIR) 10 MG tablet Take 1 tablet (10 mg total) by mouth daily.  30 tablet  11  . Olopatadine HCl (PATANASE) 0.6 % SOLN Use 1-2 sprays in each nostril two times daily  1 Bottle  11  . Olopatadine HCl 0.2 % SOLN Use one drop in  each eye daily as needed      . DISCONTD: albuterol (PROAIR HFA) 108 (90 BASE) MCG/ACT inhaler Inhale 2 puffs into the lungs every 6 (six) hours as needed.      Marland Kitchen DISCONTD: atenolol-chlorthalidone (TENORETIC) 50-25 MG per tablet Take 1 tablet by mouth daily.  30  tablet  11  . DISCONTD: fexofenadine (ALLEGRA) 180 MG tablet Take 180 mg by mouth daily.        Marland Kitchen DISCONTD: montelukast (SINGULAIR) 10 MG tablet Take 1 tablet (10 mg total) by mouth daily.  30 tablet  11  . DISCONTD: Olopatadine HCl (PATANASE) 0.6 % SOLN Use 1-2 sprays in each nostril two times daily  1 Bottle  11  . albuterol (PROAIR HFA) 108 (90 BASE) MCG/ACT inhaler Inhale 2 puffs into the lungs every 6 (six) hours as needed for wheezing.  1 Inhaler  11  . zolpidem (AMBIEN) 10 MG tablet Take 1/2 to 1 tablet by mouth as directed for sleep  30 tablet  5  . DISCONTD: ALPRAZolam (XANAX) 0.5 MG tablet Take 1/2 to 1 tablet by mouth three times daily as needed for nerves  90 tablet  5     Allergies  Allergen Reactions  . Codeine     REACTION: itching  . Penicillins     REACTION: causes severe swelling  . Sulfonamide Derivatives     REACTION: swelling    Review of Systems      The patient complains of nasal congestion.  The patient denies fever, chills, sweats, anorexia, fatigue, weakness, malaise, weight loss, sleep disorder, blurring, diplopia, eye irritation, eye discharge, vision loss, eye pain, photophobia, earache, ear discharge, tinnitus, decreased hearing, nosebleeds, sore throat, hoarseness, chest pain, palpitations, syncope, dyspnea on exertion, orthopnea, PND, peripheral edema, cough, dyspnea at rest, excessive sputum, hemoptysis, wheezing, pleurisy, nausea, vomiting, diarrhea, constipation, change in bowel habits, abdominal pain, melena, hematochezia, jaundice, gas/bloating, indigestion/heartburn, dysphagia, odynophagia, dysuria, hematuria, urinary frequency, urinary hesitancy, nocturia, incontinence, back pain, joint pain,  joint swelling, muscle cramps, muscle weakness, stiffness, arthritis, sciatica, restless legs, leg pain at night, leg pain with exertion, rash, itching, dryness, suspicious lesions, paralysis, paresthesias, seizures, tremors, vertigo, transient blindness, frequent falls, frequent headaches, difficulty walking, depression, anxiety, memory loss, confusion, cold intolerance, heat intolerance, polydipsia, polyphagia, polyuria, unusual weight change, abnormal bruising, bleeding, enlarged lymph nodes, urticaria, allergic rash, hay fever, and recurrent infections.     Objective:   Physical Exam      WD, WN, 60 y/o WF in NAD... GENERAL:  Alert & oriented; pleasant & cooperative... HEENT:  [place]/AT, EOM- full, PERRLA, EACs-pale, clear discharge,  TMs-wnl, NOSE- sl congested, THROAT-clear & wnl. NECK:  Supple w/ fairROM; no JVD; normal carotid impulses w/o bruits; no thyromegaly or nodules palpated; no lymphadenopathy. CHEST:  Clear to P & A; without wheezes/ rales/ or rhonchi heard... HEART:  Regular Rhythm; without murmurs/ rubs/ or gallops detected... ABDOMEN:  Soft & nontender; normal bowel sounds; no organomegaly or masses palpated..  EXT: without deformities,  no varicose veins/ venous insuffic/ or edema. NEURO:  CN's intact; no focal neuro deficits... DERM: neg- no lesions seen...  RADIOLOGY DATA:  Reviewed in the EPIC EMR & discussed w/ the patient...  LABORATORY DATA:  Reviewed in the EPIC EMR & discussed w/ the patient... LABS 4/13:  FLP- fair w/ sl elev TChol/LDL but good HDL;  Chems- wnl;  CBC- wnl;  TSH=0.96...   Assessment & Plan:   CPX>>  AR/ Asthma>  Stable on Allegra, Singulair, Ventolin...  HBP>  Stable on Tenoretic 50-25 daily; continue same...  CHOL>  Fair on diet alone but she has good HDL & doesn't want meds...  GI> Hx gastritis, IBS, tiny polyp>  Stable & uses OTC meds as needed...  DJD>  Similarly she uses OTC analgesics as needed & does not want  stroger prescription  med...  INSOMNIA>  We discussed options for OTC meds; we decided on trial Ambien 10mg ...   Patient's Medications  New Prescriptions   ZOLPIDEM (AMBIEN) 10 MG TABLET    Take 1/2 to 1 tablet by mouth as directed for sleep  Previous Medications   ALBUTEROL (PROAIR HFA) 108 (90 BASE) MCG/ACT INHALER    Inhale 2 puffs into the lungs every 6 (six) hours as needed for wheezing.   ESTRADIOL (ESTRACE) 1 MG TABLET    Take 1/2 tablet daily    OLOPATADINE HCL 0.2 % SOLN    Use one drop in each eye daily as needed  Modified Medications   Modified Medication Previous Medication   ALBUTEROL (PROAIR HFA) 108 (90 BASE) MCG/ACT INHALER albuterol (PROAIR HFA) 108 (90 BASE) MCG/ACT inhaler      Inhale 2 puffs into the lungs every 6 (six) hours as needed.    Inhale 2 puffs into the lungs every 6 (six) hours as needed.   ATENOLOL-CHLORTHALIDONE (TENORETIC) 50-25 MG PER TABLET atenolol-chlorthalidone (TENORETIC) 50-25 MG per tablet      Take 1 tablet by mouth daily.    Take 1 tablet by mouth daily.   FEXOFENADINE (ALLEGRA) 180 MG TABLET fexofenadine (ALLEGRA) 180 MG tablet      Take 1 tablet (180 mg total) by mouth daily.    Take 180 mg by mouth daily.     MONTELUKAST (SINGULAIR) 10 MG TABLET montelukast (SINGULAIR) 10 MG tablet      Take 1 tablet (10 mg total) by mouth daily.    Take 1 tablet (10 mg total) by mouth daily.   OLOPATADINE HCL (PATANASE) 0.6 % SOLN Olopatadine HCl (PATANASE) 0.6 % SOLN      Use 1-2 sprays in each nostril two times daily    Use 1-2 sprays in each nostril two times daily  Discontinued Medications   ALPRAZOLAM (XANAX) 0.5 MG TABLET    Take 1/2 to 1 tablet by mouth three times daily as needed for nerves

## 2011-12-30 NOTE — Progress Notes (Signed)
Quick Note:  Pt aware of results and will continue to work on a better diet. ______

## 2011-12-31 ENCOUNTER — Telehealth: Payer: Self-pay | Admitting: Pulmonary Disease

## 2011-12-31 NOTE — Telephone Encounter (Signed)
Called and spoke with Drenda Freeze and informed her ok to change from Proair to Ventolin.  I have updated this in pt's [facility]

## 2012-01-22 ENCOUNTER — Telehealth: Payer: Self-pay | Admitting: Pulmonary Disease

## 2012-01-22 MED ORDER — ALPRAZOLAM 0.5 MG PO TABS: ORAL_TABLET | ORAL | Status: DC

## 2012-01-22 NOTE — Telephone Encounter (Signed)
Called spoke with patient, advised SN okayed for the xanax and the instructions.  Pt verbalized her understanding.  Rx telephoned to verified pharmacy and med list updated accordingly.  Nothing further needed.

## 2012-01-22 NOTE — Telephone Encounter (Signed)
Per SN---ok for pt to have xanax 0.5mg    Take 1/2 to 1 tablet by mouth tid prn nerves  #90  And refill x 5.   thanks

## 2012-01-22 NOTE — Telephone Encounter (Signed)
ambien 10mg  1/2-1 tab by mouth qhs prn given at 4.4.13 ov with SN.  Called spoke with patient who reports taking the ambien x 2 weeks and did not like "how it made her feel" - felt depressed, "extremely irritable" and "no patience what so ever."  Has been off x2 weeks now.  Not sleeping well and feels "like she is on the edge all the time."  Working a full- and part-time job.    Was on xanax x3 months last year > xanax 0.5mg  1/2-1 tab tid prn given 4.3.12.  "Seemed to take the edge off" and help her sleep.  Pt is asking for this rx again.  [facility] in [place].   Allergies  Allergen Reactions  . Codeine     REACTION: itching  . Penicillins     REACTION: causes severe swelling  . Sulfonamide Derivatives     REACTION: swelling     Dr Kriste Basque please advise, thanks.

## 2012-02-22 ENCOUNTER — Other Ambulatory Visit: Payer: Self-pay | Admitting: Obstetrics & Gynecology

## 2012-02-22 DIAGNOSIS — R928 Other abnormal and inconclusive findings on diagnostic imaging of breast: Secondary | ICD-10-CM

## 2012-02-29 ENCOUNTER — Ambulatory Visit
Admission: RE | Admit: 2012-02-29 | Discharge: 2012-02-29 | Disposition: A | Discharge status: Home / Self Care | Payer: BC Managed Care – PPO | Source: Ambulatory Visit | Attending: Obstetrics & Gynecology | Admitting: Obstetrics & Gynecology

## 2012-02-29 DIAGNOSIS — R928 Other abnormal and inconclusive findings on diagnostic imaging of breast: Secondary | ICD-10-CM

## 2012-06-15 ENCOUNTER — Telehealth: Payer: Self-pay | Admitting: Pulmonary Disease

## 2012-06-15 NOTE — Telephone Encounter (Signed)
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 SN please advise if okay to give Tdap injection and FLU shot.

## 2012-06-16 NOTE — Telephone Encounter (Signed)
Per  SN---ok to get the tdap now and the pna shot is one after age 61 so she can get this once she turns 78.  thanks

## 2012-06-16 NOTE — Telephone Encounter (Signed)
I spoke with pt and is aware of SN recs. She was placed on allergy schedule for tdap and flu shot.

## 2012-06-22 ENCOUNTER — Ambulatory Visit (INDEPENDENT_AMBULATORY_CARE_PROVIDER_SITE_OTHER): Payer: BC Managed Care – PPO

## 2012-06-22 DIAGNOSIS — Z23 Encounter for immunization: Secondary | ICD-10-CM

## 2012-06-23 DIAGNOSIS — Z23 Encounter for immunization: Secondary | ICD-10-CM

## 2012-08-08 ENCOUNTER — Telehealth: Payer: Self-pay | Admitting: Pulmonary Disease

## 2012-08-08 MED ORDER — LEVOFLOXACIN 500 MG PO TABS: 500.0000 mg | ORAL_TABLET | Freq: Every day | ORAL | Status: DC

## 2012-08-08 MED ORDER — DIPHENHYD-HYDROCORT-NYSTATIN MT SUSP: OROMUCOSAL | Status: DC

## 2012-08-08 NOTE — Telephone Encounter (Signed)
Per SN--ok to call in levaquin 500 mg  #7  1 daily, mmw  #4oz  1 tsp gargle and swallow four times daily, take otc align once daily and mucinex 2 po bid with plenty of fluids.

## 2012-08-08 NOTE — Telephone Encounter (Signed)
Spoke with pt's spouse Pt has had hoarseness, fever (101), sinus pressure/HA and dry cough x 2 days Was unable to call in herself b/c she is so hoarse Would like to have something called in Last ov 12-24-11 Please advise, thanks! Allergies  Allergen Reactions  . Codeine     REACTION: itching  . Penicillins     REACTION: causes severe swelling  . Sulfonamide Derivatives     REACTION: swelling

## 2012-08-08 NOTE — Telephone Encounter (Signed)
Prescriptions faxed to the pharmacy.  Pt's spouse is aware.

## 2012-09-21 HISTORY — PX: OTHER SURGICAL HISTORY: SHX169

## 2012-10-13 ENCOUNTER — Other Ambulatory Visit: Payer: Self-pay | Admitting: Obstetrics & Gynecology

## 2012-10-13 DIAGNOSIS — N6002 Solitary cyst of left breast: Secondary | ICD-10-CM

## 2012-10-17 ENCOUNTER — Ambulatory Visit
Admission: RE | Admit: 2012-10-17 | Discharge: 2012-10-17 | Disposition: A | Discharge status: Home / Self Care | Payer: BC Managed Care – PPO | Source: Ambulatory Visit | Attending: Obstetrics & Gynecology | Admitting: Obstetrics & Gynecology

## 2012-10-17 DIAGNOSIS — N6002 Solitary cyst of left breast: Secondary | ICD-10-CM

## 2012-10-26 ENCOUNTER — Telehealth: Payer: Self-pay | Admitting: Pulmonary Disease

## 2012-10-26 MED ORDER — ALPRAZOLAM 0.5 MG PO TABS: ORAL_TABLET | ORAL | Status: DC

## 2012-10-26 NOTE — Telephone Encounter (Signed)
rx has been called to the pharmacy nothing further is needed.

## 2012-10-26 NOTE — Telephone Encounter (Signed)
Spoke with pt requesting rx for  Xanax.0.5mg . <> take 1/2 -1  Tablet tid as needed for nerves.  #90 Allergies  Allergen Reactions  . Codeine     REACTION: itching  . Penicillins     REACTION: causes severe swelling  . Sulfonamide Derivatives     REACTION: swelling    Pt due for yrly in April .  Dr Kriste Basque please advise Thank you

## 2012-12-09 ENCOUNTER — Encounter: Payer: Self-pay | Admitting: *Deleted

## 2012-12-14 ENCOUNTER — Telehealth: Payer: Self-pay | Admitting: Pulmonary Disease

## 2012-12-14 MED ORDER — ATENOLOL-CHLORTHALIDONE 50-25 MG PO TABS: 1.0000 | ORAL_TABLET | Freq: Every day | ORAL | Status: DC

## 2012-12-14 NOTE — Telephone Encounter (Signed)
Attempted to call patient at number given-wrong number for patient. I called husbands work number and gave update that Rx has been sent and got updated phone number. I have changed this in EPIC as well. Nothing else needed at this time.

## 2013-01-05 ENCOUNTER — Ambulatory Visit: Payer: BC Managed Care – PPO | Admitting: Pulmonary Disease

## 2013-01-24 ENCOUNTER — Encounter: Payer: Self-pay | Admitting: Pulmonary Disease

## 2013-01-24 ENCOUNTER — Ambulatory Visit (INDEPENDENT_AMBULATORY_CARE_PROVIDER_SITE_OTHER)
Admission: RE | Admit: 2013-01-24 | Discharge: 2013-01-24 | Disposition: A | Discharge status: Home / Self Care | Payer: BC Managed Care – PPO | Source: Ambulatory Visit | Attending: Pulmonary Disease | Admitting: Pulmonary Disease

## 2013-01-24 ENCOUNTER — Ambulatory Visit (INDEPENDENT_AMBULATORY_CARE_PROVIDER_SITE_OTHER): Payer: BC Managed Care – PPO | Admitting: Pulmonary Disease

## 2013-01-24 VITALS — BP 114/64 | HR 63 | Temp 98.3°F | Ht 64.0 in | Wt 129.0 lb

## 2013-01-24 DIAGNOSIS — J45909 Unspecified asthma, uncomplicated: Secondary | ICD-10-CM

## 2013-01-24 DIAGNOSIS — I10 Essential (primary) hypertension: Secondary | ICD-10-CM

## 2013-01-24 DIAGNOSIS — M1990 Unspecified osteoarthritis, unspecified site: Secondary | ICD-10-CM

## 2013-01-24 DIAGNOSIS — Z0000 Encounter for general adult medical examination without abnormal findings: Secondary | ICD-10-CM

## 2013-01-24 DIAGNOSIS — J309 Allergic rhinitis, unspecified: Secondary | ICD-10-CM

## 2013-01-24 DIAGNOSIS — K589 Irritable bowel syndrome without diarrhea: Secondary | ICD-10-CM

## 2013-01-24 MED ORDER — MELOXICAM 15 MG PO TABS: 15.0000 mg | ORAL_TABLET | Freq: Every day | ORAL | Status: DC

## 2013-01-24 MED ORDER — ATENOLOL-CHLORTHALIDONE 50-25 MG PO TABS: 1.0000 | ORAL_TABLET | Freq: Every day | ORAL | Status: DC

## 2013-01-24 MED ORDER — MONTELUKAST SODIUM 10 MG PO TABS: 10.0000 mg | ORAL_TABLET | Freq: Every day | ORAL | Status: DC

## 2013-01-24 NOTE — Patient Instructions (Addendum)
Today we updated your med list in our EPIC system...    Continue your current medications the same...  We wrote a new prescription for MELOXICAM 15mg - take one tab daily as needed for arthritis/ tendonitis pain...    You may also want to try the warm soaks etc...  Today we did your follow up CXR... Please return to our lab one morning for your FASTING blood work...    We will contact you w/ the results when available...   Congrats on your up-coming retirement...  Call for any questions...  Let's plan a follow up visit in 50yr, sooner if needed for problems.Marland KitchenMarland Kitchen

## 2013-01-24 NOTE — Progress Notes (Signed)
Subjective:    Patient ID: Karen Shepard, female    DOB: [birth year], 62 y.o.   MRN: [id number]  HPI 62 y/o WF here for a follow up visit...  she has multiple medical problems including:  AR & Asthma;  HBP;  Hx small HH/ Gastitis;  IBS;  DJD & neck pain;  Stress at work...  ~  December 23, 2010:  Yearly ROV & c/o prob w/ allergies & her asthma (some HAs & inhaler is too $$);  Stressful situation at school & c/o insomnia- tried TylenolPM but notes mightmares- Rec trial Alprazolam 0.5mg  1/2 to 1 tab po Tid & HS...    AR>  She uses Singulair daily, & Allegra as needed: we wrote Rx for Patanase & Pataday (for itchy eyes) to try for her allergy symptoms...    Asthma>  On the Singulair & uses Albuterol inhaler prn (Proair is least expensive copay)...    HBP>  On Tenoretic daily & BP= 128/72 today & similar at home & school she says;  Continue same Rx- she denies CP, palpit, SOB, edema, etc...    GI>  Hx mild gastritis & has OTC meds for prn use;  Known IBS & intermittent symptoms but hasn't required meds;  [place] by [place]Austin in [place] & had Colonoscopy 4/11 w/ one 4mm polyp removed, non-adenoma & f/u suggested 3yrs.    DJD>  She had left shoulder surg in [place] & post op PT which helped;  She takes Ibuprofen as needed...  ~  December 24, 2011:  Yearly ROV & Karen Shepard's CC is insomnia> she falls asleep fairly well but awakens 3-4x per night & has difficulty getting back to sleep, TylenolPM gives her nightmares she says, we discussed trying Melatonin OTC but wrote Rx for Ambien10mg  as well...    AR>  She uses Singulair daily, & Allegra as needed: we wrote Rx for Patanase & Pataday for prn use as well...    Asthma>  On the Singulair & uses Albuterol inhaler prn (she prefers Ventolin brand)...    HBP>  On Tenoretic 50/25 daily & BP= 126/70 today & similar at home & school she says;  Continue same Rx- she denies CP, palpit, SOB, edema, etc...    Chol> On diet alone & FLP is fair w/ TChol 210, TG 65, HDL 72, LDL 122; she  does not want meds & will continue diet & exercise...    GI>  Hx mild gastritis & has OTC meds for prn use; Known IBS & intermittent symptoms but hasn't required meds;  [place]ed by [place]Austin in [place] & had Colonoscopy 4/11 w/ one 4mm polyp removed, non-adenoma & f/u suggested 44yrs.    DJD>  She had left shoulder surg in [place] & post op PT which helped; she stands a lot for her job & arthritis is bothering her; uses Ibuprofen, Aleve, Advil, Tylenol, etc; offered Rx but she feels that OTCs are satid for now... LABS 4/13:  FLP- fair w/ sl elev TChol/LDL but good HDL;  Chems- wnl;  CBC- wnl;  TSH=0.96...  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    Asthma> on Singulair10 & Albuterol inhaler prn (she prefers Ventolin brand); no recent URI & no asthma exac...    HBP>  On Tenoretic 50/25 daily & BP= 114/64 today; she denies CP, palpit, SOB, edema, etc...    Chol> On diet alone & FLP 4/13 showed TChol 210, TG 65, HDL 72, LDL 122; she decided to continue same Rx- f/u FLP 5/14 is pending...    GI>  Hx mild gastritis & has OTC meds for prn use; Hx IBS- followed by DrAustin in [place] & had Colonoscopy 4/11 w/ one 4mm polyp removed, non-adenoma & f/u suggested 56yrs.    GYN> followed by DrWein w/ 1.9cm complex left breast cyst on mammograms & aspirated 1/14; they continue to follow closely...    DJD>  She had left shoulder surg in [place] & post op PT which helped; she stands a lot at work & arthritis is bothering her but gets relief w/ OTC anti-inflamm Rx; 5/14 c/o left thumb tenosynovitis- try hot soaks & Mobic15 prn... We reviewed prob list,  meds, xrays and labs> see below for updates >>  CXR 5/14 showed normal heart size, clear lungs, mild pectus excavatum... EKG 5/14 showed NSR, rate61, wnl, NAD...  LABS 5/14:  Pending & she will ret fasting...          PROBLEM LIST:    ALLERGIC RHINITIS (ICD-477.9) - on ZYRTEK OTC & SALINE as needed... hx of incr IgE level = 289 in 1997... testing by DrSharma + for dust, molds, cat- she refused immunotherapy... ~  4/10: incr allergy symptoms w/ Pred Rx and improved... ~  5/14:  Notes good spring w/o incr symptoms this yr...  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 weaned off FLOVENT 220- hasn't needed in >75yr (without current asthma problems)...  ~  CXR 3/11 showed clear lungs, NAD.Marland Kitchen. ~  CXR 4/12 showed sl pectus excavatum deformity, mild scoliosis, clear lungs... ~  CXR 5/14 showed normal heart size, clear lungs, mild pectus excavatum...  HYPERTENSION (ICD-401.9) - on TENORETIC 50-25 daily...   ~  3/11:  BP well controlled= 110/68 today and similar at home she says; denies HA, fatigue, visual changes, CP, palipit, dizziness, syncope, dyspnea, edema, etc... ~  EKG 3/11 showed NSR, WNL... ~  EKG 5/14 showed NSR, rate61, wnl, NAD... ~  BP remains well controlled on Tenoretic50-25; continue same med...  BORDERLINE CHOLESTEROL >> ~  last FLP here 9/04 showed TChol 203, TG 60, HDL 61, LDL 130... ~  FLP 6/10 showed TChol 199, TG 77, HDL 70, LDL 113... continue diet Rx. ~  FLP 4/12 showed TChol 212, TG 64, HDL 72, LDL 125 ~  FLP 4/13 showed TChol 210, TG 65, HDL 72, LDL 122  ~  FLP 5/14 on diet alone = pending  IRRITABLE BOWEL SYNDROME (ICD-564.1) >>  ~  2010: Eval by GI, DrAustin in [place] for abd pain> neg AbdSonar, then CT Abd 12/10- neg, NAD (sm liver cysts only); subseq HIDA also neg- norm GB... had EGD 11/10 DrAustin> sm HH, mild gastitis, neg HPylori... she improved w/ Levsin for prob IBS.. ~  She had screening Colonoscopy 4/11 by  DrAustin in [place] and it was neg- one sm polyp w/ benign mucosa on bx, f/u planned 28yrs.  DEGENERATIVE JOINT DISEASE (ICD-715.90) & NECK PAIN (ICD-723.1) - he DJD w/ neck pain and left shoulder pain eval & Rx by Ortho in [place], DrO'Brian &  DrCraig (also seen by DrWainer 2008)... 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 she is s/p shot in neck and shoulder... she finally had left shoulder surgery by DrCraig in [place] (frozen shoulder) w/ post-op PT and now much improved- she states neck feels better too- doing well since surg. ~  She uses OTC analgesics for her arthritis> Tylenol, Ibuprofen, Advil, Aleve, etc... ~  5/14:  Pt c/o left thumb tenosynovitis- advised hot soaks and trial Mobic15mg  prn...  Health Maintenance - s/p PNEUMOVAX 11/08 (age 75, Dx=asthma), s/p TETANUS shot 6/09, & she gets the yearly Flu shot each fall... ~  GI= DrAustin> she had neg screening colon in 4/11 ~  GYN= DrWein> she gets PAP, Mammograms (she had cyst Shepard in past), BMD (baseline reported neg & done several yrs ago) from him    Outpatient Encounter Prescriptions as of 01/24/2013  Medication Sig Dispense Refill  . albuterol (PROAIR HFA) 108 (90 BASE) MCG/ACT inhaler Inhale 2 puffs into the lungs every 6 (six) hours as needed for wheezing.  1 Inhaler  11  . atenolol-chlorthalidone (TENORETIC) 50-25 MG per tablet Take 1 tablet by mouth daily.  30 tablet  11  . cetirizine (ZYRTEC) 10 MG tablet Take 10 mg by mouth daily.      Marland Kitchen estradiol (ESTRACE) 1 MG tablet Take 1/2 tablet daily       . montelukast (SINGULAIR) 10 MG tablet Take 1 tablet (10 mg total) by mouth daily.  30 tablet  11  . [DISCONTINUED] atenolol-chlorthalidone (TENORETIC) 50-25 MG per tablet Take 1 tablet by mouth daily.  30 tablet  1  . [DISCONTINUED] montelukast (SINGULAIR) 10 MG tablet Take 1 tablet (10 mg total) by mouth daily.  30 tablet  11  . meloxicam (MOBIC) 15 MG tablet Take 1 tablet (15 mg total) by mouth daily.  30 tablet  11  .  [DISCONTINUED] albuterol (VENTOLIN HFA) 108 (90 BASE) MCG/ACT inhaler Inhale 2 puffs into the lungs every 6 (six) hours as needed.      . [DISCONTINUED] ALPRAZolam (XANAX) 0.5 MG tablet Take 1/2-1 tab by mouth three times daily as needed for nerves  90 tablet  0  . [DISCONTINUED] Diphenhyd-Hydrocort-Nystatin SUSP Gargle and swallow 1 teaspoon four times daily  120 mL  0  . [DISCONTINUED] fexofenadine (ALLEGRA) 180 MG tablet Take 1 tablet (180 mg total) by mouth daily.  30 tablet  11  . [DISCONTINUED] levofloxacin (LEVAQUIN) 500 MG tablet Take 1 tablet (500 mg total) by mouth daily.  7 tablet  0  . [DISCONTINUED] Olopatadine HCl (PATANASE) 0.6 % SOLN Use 1-2 sprays in each nostril two times daily  1 Bottle  11  . [DISCONTINUED] Olopatadine HCl 0.2 % SOLN Use one drop in each eye daily as needed      . [DISCONTINUED] zolpidem (AMBIEN) 10 MG tablet Take 1/2 to 1 tablet by mouth as directed for sleep  30 tablet  5   No facility-administered encounter medications on file as of 01/24/2013.     Allergies  Allergen Reactions  . Codeine     REACTION: itching  . Penicillins     REACTION: causes severe swelling  . Sulfonamide Derivatives     REACTION: swelling    Review of Systems      The patient complains of nasal congestion.  The patient denies fever, chills, sweats, anorexia, fatigue, weakness, malaise, weight loss, sleep disorder, blurring, diplopia, eye irritation, eye discharge, vision loss, eye pain, photophobia, earache, ear discharge,  tinnitus, decreased hearing, nosebleeds, sore throat, hoarseness, chest pain, palpitations, syncope, dyspnea on exertion, orthopnea, PND, peripheral edema, cough, dyspnea at rest, excessive sputum, hemoptysis, wheezing, pleurisy, nausea, vomiting, diarrhea, constipation, change in bowel habits, abdominal pain, melena, hematochezia, jaundice, gas/bloating, indigestion/heartburn, dysphagia, odynophagia, dysuria, hematuria, urinary frequency, urinary hesitancy,  nocturia, incontinence, back pain, joint pain, joint swelling, muscle cramps, muscle weakness, stiffness, arthritis, sciatica, restless legs, leg pain at night, leg pain with exertion, rash, itching, dryness, suspicious lesions, paralysis, paresthesias, seizures, tremors, vertigo, transient blindness, frequent falls, frequent headaches, difficulty walking, depression, anxiety, memory loss, confusion, cold intolerance, heat intolerance, polydipsia, polyphagia, polyuria, unusual weight change, abnormal bruising, bleeding, enlarged lymph nodes, urticaria, allergic rash, hay fever, and recurrent infections.     Objective:   Physical Exam      WD, WN, 62 y/o WF in NAD... GENERAL:  Alert & oriented; pleasant & cooperative... HEENT:  [place]/AT, EOM- full, PERRLA, EACs-pale, clear discharge,  TMs-wnl, NOSE- sl congested, THROAT-clear & wnl. NECK:  Supple w/ fairROM; no JVD; normal carotid impulses w/o bruits; no thyromegaly or nodules palpated; no lymphadenopathy. CHEST:  Clear to P & A; without wheezes/ rales/ or rhonchi heard... HEART:  Regular Rhythm; without murmurs/ rubs/ or gallops detected... ABDOMEN:  Soft & nontender; normal bowel sounds; no organomegaly or masses palpated..  EXT: without deformities, sl tender left thumb, no varicose veins/ venous insuffic/ or edema. NEURO:  CN's intact; no focal neuro deficits... DERM: neg- no lesions seen...  RADIOLOGY DATA:  Reviewed in the EPIC EMR & discussed w/ the patient...  LABORATORY DATA:  Reviewed in the EPIC EMR & discussed w/ the patient...   Assessment & Plan:   CPX>>  AR/ Asthma>  Stable on Zyrtek, Singulair, Ventolin...  HBP>  Stable on Tenoretic 50-25 daily; continue same...  CHOL>  Fair on diet alone & she has good HDL & doesn't want meds...  GI> Hx gastritis, IBS, tiny polyp>  Stable & uses OTC meds as needed...  GYN>  She had left breast cyst Shepard 1/14 & they continue to follow closely...  DJD>  Similarly she uses OTC analgesics  as needed & does not want stroger prescription med...  Hx INSOMNIA>  She is off the Ambien now & resting better...   Patient's Medications  New Prescriptions   MELOXICAM (MOBIC) 15 MG TABLET    Take 1 tablet (15 mg total) by mouth daily.  Previous Medications   ALBUTEROL (PROAIR HFA) 108 (90 BASE) MCG/ACT INHALER    Inhale 2 puffs into the lungs every 6 (six) hours as needed for wheezing.   CETIRIZINE (ZYRTEC) 10 MG TABLET    Take 10 mg by mouth daily.   ESTRADIOL (ESTRACE) 1 MG TABLET    Take 1/2 tablet daily   Modified Medications   Modified Medication Previous Medication   ATENOLOL-CHLORTHALIDONE (TENORETIC) 50-25 MG PER TABLET atenolol-chlorthalidone (TENORETIC) 50-25 MG per tablet      Take 1 tablet by mouth daily.    Take 1 tablet by mouth daily.   MONTELUKAST (SINGULAIR) 10 MG TABLET montelukast (SINGULAIR) 10 MG tablet      Take 1 tablet (10 mg total) by mouth daily.    Take 1 tablet (10 mg total) by mouth daily.  Discontinued Medications   ALBUTEROL (VENTOLIN HFA) 108 (90 BASE) MCG/ACT INHALER    Inhale 2 puffs into the lungs every 6 (six) hours as needed.   ALPRAZOLAM (XANAX) 0.5 MG TABLET    Take 1/2-1 tab by mouth three times daily as  needed for nerves   DIPHENHYD-HYDROCORT-NYSTATIN SUSP    Gargle and swallow 1 teaspoon four times daily   FEXOFENADINE (ALLEGRA) 180 MG TABLET    Take 1 tablet (180 mg total) by mouth daily.   LEVOFLOXACIN (LEVAQUIN) 500 MG TABLET    Take 1 tablet (500 mg total) by mouth daily.   OLOPATADINE HCL (PATANASE) 0.6 % SOLN    Use 1-2 sprays in each nostril two times daily   OLOPATADINE HCL 0.2 % SOLN    Use one drop in each eye daily as needed   ZOLPIDEM (AMBIEN) 10 MG TABLET    Take 1/2 to 1 tablet by mouth as directed for sleep

## 2013-03-13 ENCOUNTER — Other Ambulatory Visit: Payer: Self-pay | Admitting: Obstetrics & Gynecology

## 2013-03-13 DIAGNOSIS — N6002 Solitary cyst of left breast: Secondary | ICD-10-CM

## 2013-03-22 ENCOUNTER — Ambulatory Visit
Admission: RE | Admit: 2013-03-22 | Discharge: 2013-03-22 | Disposition: A | Discharge status: Home / Self Care | Payer: BC Managed Care – PPO | Source: Ambulatory Visit | Attending: Obstetrics & Gynecology | Admitting: Obstetrics & Gynecology

## 2013-03-22 DIAGNOSIS — N6002 Solitary cyst of left breast: Secondary | ICD-10-CM

## 2013-03-30 ENCOUNTER — Other Ambulatory Visit (INDEPENDENT_AMBULATORY_CARE_PROVIDER_SITE_OTHER): Payer: BC Managed Care – PPO

## 2013-03-30 DIAGNOSIS — Z0000 Encounter for general adult medical examination without abnormal findings: Secondary | ICD-10-CM

## 2013-03-30 LAB — BASIC METABOLIC PANEL
BUN: 14 mg/dL (ref 6–23)
CO2: 29 mEq/L (ref 19–32)
Calcium: 9.8 mg/dL (ref 8.4–10.5)
Creatinine, Ser: 0.8 mg/dL (ref 0.4–1.2)
Glucose, Bld: 91 mg/dL (ref 70–99)
Sodium: 142 mEq/L (ref 135–145)

## 2013-03-30 LAB — CBC WITH DIFFERENTIAL/PLATELET
Basophils Absolute: 0 10*3/uL (ref 0.0–0.1)
Eosinophils Relative: 4.1 % (ref 0.0–5.0)
Hemoglobin: 13.7 g/dL (ref 12.0–15.0)
Lymphocytes Relative: 43.3 % (ref 12.0–46.0)
Monocytes Relative: 6.9 % (ref 3.0–12.0)
Neutro Abs: 2.8 10*3/uL (ref 1.4–7.7)
RDW: 13 % (ref 11.5–14.6)
WBC: 6.3 10*3/uL (ref 4.5–10.5)

## 2013-03-30 LAB — HEPATIC FUNCTION PANEL
Albumin: 4.4 g/dL (ref 3.5–5.2)
Alkaline Phosphatase: 51 U/L (ref 39–117)
Total Protein: 7.7 g/dL (ref 6.0–8.3)

## 2013-03-30 LAB — LIPID PANEL
Cholesterol: 183 mg/dL (ref 0–200)
HDL: 68.2 mg/dL (ref >39.00)
Triglycerides: 94 mg/dL (ref 0.0–149.0)
VLDL: 18.8 mg/dL (ref 0.0–40.0)

## 2013-03-30 LAB — TSH: TSH: 1.27 u[IU]/mL (ref 0.35–5.50)

## 2013-07-04 ENCOUNTER — Ambulatory Visit (INDEPENDENT_AMBULATORY_CARE_PROVIDER_SITE_OTHER): Payer: BC Managed Care – PPO

## 2013-07-04 DIAGNOSIS — Z23 Encounter for immunization: Secondary | ICD-10-CM

## 2013-08-16 ENCOUNTER — Telehealth: Payer: Self-pay | Admitting: Pulmonary Disease

## 2013-08-16 MED ORDER — CHLORZOXAZONE 500 MG PO TABS: 500.0000 mg | ORAL_TABLET | Freq: Three times a day (TID) | ORAL | Status: DC | PRN

## 2013-08-16 NOTE — Telephone Encounter (Signed)
Per SN---  Rest Heat to the area myoflex cream as needed otc Parafon forte 1 po tid prn  Use pain pill as needed

## 2013-08-16 NOTE — Telephone Encounter (Signed)
Called and spoke with pt and she stated that she has pulled some muscles in her back.  She stated that she has done this before in the past.  She has been using heat on this as this feels better than the ice.  Pt stated that she has some meloxicam but did not know if SN had any further recs.  Pt stated that once she gets up and moves around the pain is not as bad.  SN please advise. Thanks  Allergies  Allergen Reactions  . Codeine     REACTION: itching  . Penicillins     REACTION: causes severe swelling  . Sulfonamide Derivatives     REACTION: swelling     Current Outpatient Prescriptions on File Prior to Visit  Medication Sig Dispense Refill  . albuterol (PROAIR HFA) 108 (90 BASE) MCG/ACT inhaler Inhale 2 puffs into the lungs every 6 (six) hours as needed for wheezing.  1 Inhaler  11  . atenolol-chlorthalidone (TENORETIC) 50-25 MG per tablet Take 1 tablet by mouth daily.  30 tablet  11  . cetirizine (ZYRTEC) 10 MG tablet Take 10 mg by mouth daily.      Marland Kitchen estradiol (ESTRACE) 1 MG tablet Take 1/2 tablet daily       . meloxicam (MOBIC) 15 MG tablet Take 1 tablet (15 mg total) by mouth daily.  30 tablet  11  . montelukast (SINGULAIR) 10 MG tablet Take 1 tablet (10 mg total) by mouth daily.  30 tablet  11   No current facility-administered medications on file prior to visit.

## 2013-08-16 NOTE — Telephone Encounter (Signed)
Called spoke with patient, advised of SN's recs as stated below.  Pt verbalized her understanding and denied any further questions/concerns at this time.  Parafon forte rx sent to pharmacy.  Pt aware of the office's holiday hours and that she may call the on-call provider tomorrow if needed.  Nothing further needed at this time; will sign off.  **no quantity was authorized by SN >> okay to send #50 per Leigh.

## 2014-01-24 ENCOUNTER — Telehealth: Payer: Self-pay | Admitting: Pulmonary Disease

## 2014-01-24 NOTE — Telephone Encounter (Signed)
Spoke with pt and advised that Dr Lenna Gilford is no longer seeing primary care as of 12/20/13.  Pt given info regarding other physicians within [facility].  Pt is going to check with her insurance to see who is covered and then make appt.  Advised her that Dr Lenna Gilford can still refill her prescriptions until appt with new PCP.

## 2014-01-28 ENCOUNTER — Other Ambulatory Visit: Payer: Self-pay | Admitting: Pulmonary Disease

## 2014-03-15 ENCOUNTER — Other Ambulatory Visit: Payer: Self-pay

## 2014-03-15 DIAGNOSIS — Z1231 Encounter for screening mammogram for malignant neoplasm of breast: Secondary | ICD-10-CM

## 2014-04-02 ENCOUNTER — Encounter (INDEPENDENT_AMBULATORY_CARE_PROVIDER_SITE_OTHER): Payer: Self-pay

## 2014-04-02 ENCOUNTER — Ambulatory Visit
Admission: RE | Admit: 2014-04-02 | Discharge: 2014-04-02 | Disposition: A | Discharge status: Home / Self Care | Payer: BC Managed Care – PPO | Source: Ambulatory Visit

## 2014-04-02 DIAGNOSIS — Z1231 Encounter for screening mammogram for malignant neoplasm of breast: Secondary | ICD-10-CM

## 2014-04-05 ENCOUNTER — Other Ambulatory Visit: Payer: Self-pay | Admitting: Obstetrics & Gynecology

## 2014-04-05 DIAGNOSIS — R928 Other abnormal and inconclusive findings on diagnostic imaging of breast: Secondary | ICD-10-CM

## 2014-04-10 ENCOUNTER — Ambulatory Visit
Admission: RE | Admit: 2014-04-10 | Discharge: 2014-04-10 | Disposition: A | Discharge status: Home / Self Care | Payer: BC Managed Care – PPO | Source: Ambulatory Visit | Attending: Obstetrics & Gynecology | Admitting: Obstetrics & Gynecology

## 2014-04-10 DIAGNOSIS — R928 Other abnormal and inconclusive findings on diagnostic imaging of breast: Secondary | ICD-10-CM

## 2015-03-08 ENCOUNTER — Other Ambulatory Visit: Payer: Self-pay

## 2015-03-08 DIAGNOSIS — Z1231 Encounter for screening mammogram for malignant neoplasm of breast: Secondary | ICD-10-CM

## 2015-04-08 ENCOUNTER — Ambulatory Visit
Admission: RE | Admit: 2015-04-08 | Discharge: 2015-04-08 | Disposition: A | Discharge status: Home / Self Care | Payer: BC Managed Care – PPO | Source: Ambulatory Visit

## 2015-04-08 DIAGNOSIS — Z1231 Encounter for screening mammogram for malignant neoplasm of breast: Secondary | ICD-10-CM

## 2016-03-30 ENCOUNTER — Other Ambulatory Visit (HOSPITAL_BASED_OUTPATIENT_CLINIC_OR_DEPARTMENT_OTHER): Payer: Self-pay | Admitting: Obstetrics & Gynecology

## 2016-03-30 DIAGNOSIS — Z1231 Encounter for screening mammogram for malignant neoplasm of breast: Secondary | ICD-10-CM

## 2016-04-09 ENCOUNTER — Ambulatory Visit (HOSPITAL_BASED_OUTPATIENT_CLINIC_OR_DEPARTMENT_OTHER): Payer: BC Managed Care – PPO

## 2016-04-14 ENCOUNTER — Ambulatory Visit (HOSPITAL_BASED_OUTPATIENT_CLINIC_OR_DEPARTMENT_OTHER)
Admission: RE | Admit: 2016-04-14 | Discharge: 2016-04-14 | Disposition: A | Discharge status: Home / Self Care | Payer: Medicare Other | Source: Ambulatory Visit | Attending: Obstetrics & Gynecology | Admitting: Obstetrics & Gynecology

## 2016-04-14 DIAGNOSIS — R928 Other abnormal and inconclusive findings on diagnostic imaging of breast: Secondary | ICD-10-CM | POA: Insufficient documentation

## 2016-04-14 DIAGNOSIS — Z1231 Encounter for screening mammogram for malignant neoplasm of breast: Secondary | ICD-10-CM | POA: Insufficient documentation

## 2016-04-16 ENCOUNTER — Other Ambulatory Visit: Payer: Self-pay | Admitting: Obstetrics & Gynecology

## 2016-04-16 DIAGNOSIS — R928 Other abnormal and inconclusive findings on diagnostic imaging of breast: Secondary | ICD-10-CM

## 2016-04-24 ENCOUNTER — Ambulatory Visit
Admission: RE | Admit: 2016-04-24 | Discharge: 2016-04-24 | Disposition: A | Discharge status: Home / Self Care | Payer: Medicare Other | Source: Ambulatory Visit | Attending: Obstetrics & Gynecology | Admitting: Obstetrics & Gynecology

## 2016-04-24 DIAGNOSIS — R928 Other abnormal and inconclusive findings on diagnostic imaging of breast: Secondary | ICD-10-CM

## 2017-04-01 ENCOUNTER — Other Ambulatory Visit: Payer: Self-pay | Admitting: Obstetrics & Gynecology

## 2017-04-01 DIAGNOSIS — Z1231 Encounter for screening mammogram for malignant neoplasm of breast: Secondary | ICD-10-CM

## 2017-05-05 ENCOUNTER — Ambulatory Visit
Admission: RE | Admit: 2017-05-05 | Discharge: 2017-05-05 | Disposition: A | Discharge status: Home / Self Care | Payer: Medicare Other | Source: Ambulatory Visit | Attending: Obstetrics & Gynecology | Admitting: Obstetrics & Gynecology

## 2017-05-05 DIAGNOSIS — Z1231 Encounter for screening mammogram for malignant neoplasm of breast: Secondary | ICD-10-CM

## 2018-04-12 ENCOUNTER — Other Ambulatory Visit: Payer: Self-pay | Admitting: Obstetrics & Gynecology

## 2018-04-12 DIAGNOSIS — Z1231 Encounter for screening mammogram for malignant neoplasm of breast: Secondary | ICD-10-CM

## 2018-05-12 ENCOUNTER — Ambulatory Visit: Payer: Medicare Other

## 2018-05-16 ENCOUNTER — Ambulatory Visit
Admission: RE | Admit: 2018-05-16 | Discharge: 2018-05-16 | Disposition: A | Discharge status: Home / Self Care | Payer: Medicare Other | Source: Ambulatory Visit | Attending: Obstetrics & Gynecology | Admitting: Obstetrics & Gynecology

## 2018-05-16 DIAGNOSIS — Z1231 Encounter for screening mammogram for malignant neoplasm of breast: Secondary | ICD-10-CM

## 2019-04-20 ENCOUNTER — Other Ambulatory Visit: Payer: Self-pay | Admitting: Obstetrics & Gynecology

## 2019-04-20 DIAGNOSIS — Z1231 Encounter for screening mammogram for malignant neoplasm of breast: Secondary | ICD-10-CM

## 2019-06-12 ENCOUNTER — Ambulatory Visit
Admission: RE | Admit: 2019-06-12 | Discharge: 2019-06-12 | Disposition: A | Discharge status: Home / Self Care | Payer: Medicare Other | Source: Ambulatory Visit | Attending: Obstetrics & Gynecology | Admitting: Obstetrics & Gynecology

## 2019-06-12 ENCOUNTER — Other Ambulatory Visit: Payer: Self-pay

## 2019-06-12 DIAGNOSIS — Z1231 Encounter for screening mammogram for malignant neoplasm of breast: Secondary | ICD-10-CM

## 2020-05-14 ENCOUNTER — Other Ambulatory Visit: Payer: Self-pay | Admitting: Obstetrics & Gynecology

## 2020-05-14 DIAGNOSIS — Z1231 Encounter for screening mammogram for malignant neoplasm of breast: Secondary | ICD-10-CM

## 2020-06-12 ENCOUNTER — Ambulatory Visit
Admission: RE | Admit: 2020-06-12 | Discharge: 2020-06-12 | Disposition: A | Discharge status: Home / Self Care | Payer: Medicare PPO | Source: Ambulatory Visit | Attending: Obstetrics & Gynecology | Admitting: Obstetrics & Gynecology

## 2020-06-12 ENCOUNTER — Other Ambulatory Visit: Payer: Self-pay

## 2020-06-12 DIAGNOSIS — Z1231 Encounter for screening mammogram for malignant neoplasm of breast: Secondary | ICD-10-CM

## 2020-06-13 ENCOUNTER — Other Ambulatory Visit: Payer: Self-pay | Admitting: Obstetrics & Gynecology

## 2020-06-13 DIAGNOSIS — R928 Other abnormal and inconclusive findings on diagnostic imaging of breast: Secondary | ICD-10-CM

## 2020-06-28 ENCOUNTER — Other Ambulatory Visit: Payer: Self-pay

## 2020-06-28 ENCOUNTER — Ambulatory Visit
Admission: RE | Admit: 2020-06-28 | Discharge: 2020-06-28 | Disposition: A | Discharge status: Home / Self Care | Payer: Medicare PPO | Source: Ambulatory Visit | Attending: Obstetrics & Gynecology | Admitting: Obstetrics & Gynecology

## 2020-06-28 ENCOUNTER — Other Ambulatory Visit: Payer: Self-pay | Admitting: Obstetrics & Gynecology

## 2020-06-28 DIAGNOSIS — N6320 Unspecified lump in the left breast, unspecified quadrant: Secondary | ICD-10-CM

## 2020-06-28 DIAGNOSIS — R928 Other abnormal and inconclusive findings on diagnostic imaging of breast: Secondary | ICD-10-CM

## 2020-07-12 ENCOUNTER — Other Ambulatory Visit: Payer: Self-pay

## 2020-07-12 ENCOUNTER — Ambulatory Visit
Admission: RE | Admit: 2020-07-12 | Discharge: 2020-07-12 | Disposition: A | Discharge status: Home / Self Care | Payer: Medicare PPO | Source: Ambulatory Visit | Attending: Obstetrics & Gynecology | Admitting: Obstetrics & Gynecology

## 2020-07-12 DIAGNOSIS — N6320 Unspecified lump in the left breast, unspecified quadrant: Secondary | ICD-10-CM

## 2021-01-01 ENCOUNTER — Other Ambulatory Visit: Payer: Self-pay | Admitting: Obstetrics & Gynecology

## 2021-01-01 DIAGNOSIS — R928 Other abnormal and inconclusive findings on diagnostic imaging of breast: Secondary | ICD-10-CM

## 2021-08-13 ENCOUNTER — Ambulatory Visit
Admission: RE | Admit: 2021-08-13 | Discharge: 2021-08-13 | Disposition: A | Discharge status: Home / Self Care | Payer: Medicare PPO | Source: Ambulatory Visit | Attending: Obstetrics & Gynecology | Admitting: Obstetrics & Gynecology

## 2021-08-13 ENCOUNTER — Other Ambulatory Visit: Payer: Self-pay

## 2021-08-13 DIAGNOSIS — R928 Other abnormal and inconclusive findings on diagnostic imaging of breast: Secondary | ICD-10-CM

## 2021-09-09 ENCOUNTER — Ambulatory Visit: Payer: Self-pay | Admitting: General Surgery

## 2021-09-09 DIAGNOSIS — N6320 Unspecified lump in the left breast, unspecified quadrant: Secondary | ICD-10-CM

## 2021-09-10 ENCOUNTER — Other Ambulatory Visit: Payer: Self-pay | Admitting: General Surgery

## 2021-09-10 DIAGNOSIS — N6320 Unspecified lump in the left breast, unspecified quadrant: Secondary | ICD-10-CM

## 2021-10-07 ENCOUNTER — Other Ambulatory Visit: Payer: Self-pay

## 2021-10-07 ENCOUNTER — Encounter (HOSPITAL_BASED_OUTPATIENT_CLINIC_OR_DEPARTMENT_OTHER): Payer: Self-pay | Admitting: General Surgery

## 2021-10-08 ENCOUNTER — Other Ambulatory Visit: Payer: Self-pay

## 2021-10-08 ENCOUNTER — Encounter (HOSPITAL_BASED_OUTPATIENT_CLINIC_OR_DEPARTMENT_OTHER)
Admission: RE | Admit: 2021-10-08 | Discharge: 2021-10-08 | Disposition: A | Discharge status: Home / Self Care | Payer: Medicare HMO | Source: Ambulatory Visit | Attending: General Surgery | Admitting: General Surgery

## 2021-10-08 DIAGNOSIS — Z79899 Other long term (current) drug therapy: Secondary | ICD-10-CM | POA: Diagnosis not present

## 2021-10-08 DIAGNOSIS — Z01818 Encounter for other preprocedural examination: Secondary | ICD-10-CM | POA: Insufficient documentation

## 2021-10-08 LAB — BASIC METABOLIC PANEL
Anion gap: 8 (ref 5–15)
BUN: 10 mg/dL (ref 8–23)
CO2: 30 mmol/L (ref 22–32)
Calcium: 9.5 mg/dL (ref 8.9–10.3)
Chloride: 101 mmol/L (ref 98–111)
Creatinine, Ser: 0.86 mg/dL (ref 0.44–1.00)
GFR, Estimated: >60 mL/min (ref >60)
Glucose, Bld: 95 mg/dL (ref 70–99)
Potassium: 4.2 mmol/L (ref 3.5–5.1)
Sodium: 139 mmol/L (ref 135–145)

## 2021-10-08 NOTE — Progress Notes (Signed)
Ensure presurgery drink given to pt with instruction to complete 2 hours prior to arrival time on DOS. CHG soap given with written instruction. Written instructions also given for general preop instructions. Pt verbalized understanding.       Enhanced Recovery after Surgery for Orthopedics Enhanced Recovery after Surgery is a protocol used to improve the stress on your body and your recovery after surgery.  Patient Instructions  The night before surgery:  No food after midnight. ONLY clear liquids after midnight  The day of surgery (if you do NOT have diabetes):  Drink ONE (1) Pre-Surgery Clear Ensure as directed.   This drink was given to you during your hospital  pre-op appointment visit. The pre-op nurse will instruct you on the time to drink the  Pre-Surgery Ensure depending on your surgery time. Finish the drink at the designated time by the pre-op nurse.  Nothing else to drink after completing the  Pre-Surgery Clear Ensure.  The day of surgery (if you have diabetes): Drink ONE (1) Gatorade 2 (G2) as directed. This drink was given to you during your hospital  pre-op appointment visit.  The pre-op nurse will instruct you on the time to drink the   Gatorade 2 (G2) depending on your surgery time. Color of the Gatorade may vary. Red is not allowed. Nothing else to drink after completing the  Gatorade 2 (G2).         If you have questions, please contact your surgeons office.

## 2021-10-16 ENCOUNTER — Ambulatory Visit
Admission: RE | Admit: 2021-10-16 | Discharge: 2021-10-16 | Disposition: A | Discharge status: Home / Self Care | Payer: Medicare HMO | Source: Ambulatory Visit | Attending: General Surgery | Admitting: General Surgery

## 2021-10-16 DIAGNOSIS — N6320 Unspecified lump in the left breast, unspecified quadrant: Secondary | ICD-10-CM

## 2021-10-17 ENCOUNTER — Ambulatory Visit (HOSPITAL_BASED_OUTPATIENT_CLINIC_OR_DEPARTMENT_OTHER): Payer: Medicare HMO | Admitting: Anesthesiology

## 2021-10-17 ENCOUNTER — Other Ambulatory Visit: Payer: Self-pay

## 2021-10-17 ENCOUNTER — Ambulatory Visit
Admission: RE | Admit: 2021-10-17 | Discharge: 2021-10-17 | Disposition: A | Discharge status: Home / Self Care | Payer: Medicare PPO | Source: Ambulatory Visit | Attending: General Surgery | Admitting: General Surgery

## 2021-10-17 ENCOUNTER — Encounter (HOSPITAL_BASED_OUTPATIENT_CLINIC_OR_DEPARTMENT_OTHER)
Admission: RE | Disposition: A | Discharge status: Home / Self Care | Payer: Self-pay | Source: Home / Self Care | Attending: General Surgery

## 2021-10-17 ENCOUNTER — Ambulatory Visit (HOSPITAL_BASED_OUTPATIENT_CLINIC_OR_DEPARTMENT_OTHER)
Admission: RE | Admit: 2021-10-17 | Discharge: 2021-10-17 | Disposition: A | Discharge status: Home / Self Care | Payer: Medicare HMO | Attending: General Surgery | Admitting: General Surgery

## 2021-10-17 ENCOUNTER — Encounter (HOSPITAL_BASED_OUTPATIENT_CLINIC_OR_DEPARTMENT_OTHER): Payer: Self-pay | Admitting: General Surgery

## 2021-10-17 DIAGNOSIS — D242 Benign neoplasm of left breast: Secondary | ICD-10-CM | POA: Diagnosis not present

## 2021-10-17 DIAGNOSIS — Z803 Family history of malignant neoplasm of breast: Secondary | ICD-10-CM | POA: Diagnosis not present

## 2021-10-17 DIAGNOSIS — I10 Essential (primary) hypertension: Secondary | ICD-10-CM | POA: Insufficient documentation

## 2021-10-17 DIAGNOSIS — Z87891 Personal history of nicotine dependence: Secondary | ICD-10-CM | POA: Diagnosis not present

## 2021-10-17 DIAGNOSIS — N6082 Other benign mammary dysplasias of left breast: Secondary | ICD-10-CM | POA: Diagnosis present

## 2021-10-17 DIAGNOSIS — Z79899 Other long term (current) drug therapy: Secondary | ICD-10-CM

## 2021-10-17 DIAGNOSIS — J45909 Unspecified asthma, uncomplicated: Secondary | ICD-10-CM | POA: Diagnosis not present

## 2021-10-17 DIAGNOSIS — N6320 Unspecified lump in the left breast, unspecified quadrant: Secondary | ICD-10-CM

## 2021-10-17 HISTORY — PX: BREAST LUMPECTOMY WITH RADIOACTIVE SEED LOCALIZATION: SHX6424

## 2021-10-17 SURGERY — BREAST LUMPECTOMY WITH RADIOACTIVE SEED LOCALIZATION
Anesthesia: General | Site: Breast | Laterality: Left

## 2021-10-17 MED ORDER — TRAMADOL HCL 50 MG PO TABS: 50.0000 mg | ORAL_TABLET | Freq: Four times a day (QID) | ORAL | 0 refills | Status: AC | PRN

## 2021-10-17 MED ORDER — CHLORHEXIDINE GLUCONATE CLOTH 2 % EX PADS: 6.0000 | MEDICATED_PAD | Freq: Once | CUTANEOUS | Status: DC

## 2021-10-17 MED ORDER — ONDANSETRON HCL 4 MG/2ML IJ SOLN
INTRAMUSCULAR | Status: DC | PRN
  Administered 2021-10-17: 4 mg via INTRAVENOUS

## 2021-10-17 MED ORDER — EPHEDRINE 5 MG/ML INJ
INTRAVENOUS | Status: AC
  Filled 2021-10-17: qty 5

## 2021-10-17 MED ORDER — GABAPENTIN 300 MG PO CAPS
300.0000 mg | ORAL_CAPSULE | ORAL | Status: AC
  Administered 2021-10-17: 300 mg via ORAL

## 2021-10-17 MED ORDER — PROPOFOL 10 MG/ML IV BOLUS
INTRAVENOUS | Status: DC | PRN
Start: 2021-10-17 — End: 2021-10-17
  Administered 2021-10-17: 100 mg via INTRAVENOUS

## 2021-10-17 MED ORDER — FENTANYL CITRATE (PF) 100 MCG/2ML IJ SOLN
INTRAMUSCULAR | Status: DC | PRN
  Administered 2021-10-17: 50 ug via INTRAVENOUS

## 2021-10-17 MED ORDER — OXYCODONE HCL 5 MG/5ML PO SOLN: 5.0000 mg | Freq: Once | ORAL | Status: DC | PRN

## 2021-10-17 MED ORDER — BUPIVACAINE-EPINEPHRINE (PF) 0.25% -1:200000 IJ SOLN
INTRAMUSCULAR | Status: DC | PRN
  Administered 2021-10-17: 20 mL

## 2021-10-17 MED ORDER — LIDOCAINE HCL (CARDIAC) PF 100 MG/5ML IV SOSY
PREFILLED_SYRINGE | INTRAVENOUS | Status: DC | PRN
  Administered 2021-10-17: 60 mg via INTRAVENOUS

## 2021-10-17 MED ORDER — EPHEDRINE SULFATE (PRESSORS) 50 MG/ML IJ SOLN
INTRAMUSCULAR | Status: DC | PRN
  Administered 2021-10-17: 5 mg via INTRAVENOUS
  Administered 2021-10-17: 10 mg via INTRAVENOUS

## 2021-10-17 MED ORDER — DROPERIDOL 2.5 MG/ML IJ SOLN: 0.6250 mg | Freq: Once | INTRAMUSCULAR | Status: DC | PRN

## 2021-10-17 MED ORDER — DEXAMETHASONE SODIUM PHOSPHATE 4 MG/ML IJ SOLN
INTRAMUSCULAR | Status: DC | PRN
  Administered 2021-10-17: 5 mg via INTRAVENOUS

## 2021-10-17 MED ORDER — ATROPINE SULFATE 0.4 MG/ML IV SOLN
INTRAVENOUS | Status: AC
  Filled 2021-10-17: qty 1

## 2021-10-17 MED ORDER — FENTANYL CITRATE (PF) 100 MCG/2ML IJ SOLN
INTRAMUSCULAR | Status: AC
  Filled 2021-10-17: qty 2

## 2021-10-17 MED ORDER — MIDAZOLAM HCL 5 MG/5ML IJ SOLN
INTRAMUSCULAR | Status: DC | PRN
  Administered 2021-10-17: 1 mg via INTRAVENOUS

## 2021-10-17 MED ORDER — ACETAMINOPHEN 500 MG PO TABS
1000.0000 mg | ORAL_TABLET | ORAL | Status: AC
  Administered 2021-10-17: 1000 mg via ORAL

## 2021-10-17 MED ORDER — PHENYLEPHRINE 40 MCG/ML (10ML) SYRINGE FOR IV PUSH (FOR BLOOD PRESSURE SUPPORT)
PREFILLED_SYRINGE | INTRAVENOUS | Status: AC
  Filled 2021-10-17: qty 10

## 2021-10-17 MED ORDER — HYDROMORPHONE HCL 1 MG/ML IJ SOLN: 0.2500 mg | INTRAMUSCULAR | Status: DC | PRN

## 2021-10-17 MED ORDER — ONDANSETRON HCL 4 MG/2ML IJ SOLN
INTRAMUSCULAR | Status: AC
  Filled 2021-10-17: qty 2

## 2021-10-17 MED ORDER — VANCOMYCIN HCL IN DEXTROSE 1-5 GM/200ML-% IV SOLN
INTRAVENOUS | Status: AC
  Filled 2021-10-17: qty 200

## 2021-10-17 MED ORDER — VANCOMYCIN HCL IN DEXTROSE 1-5 GM/200ML-% IV SOLN
1000.0000 mg | INTRAVENOUS | Status: AC
  Administered 2021-10-17: 1000 mg via INTRAVENOUS

## 2021-10-17 MED ORDER — ACETAMINOPHEN 500 MG PO TABS
ORAL_TABLET | ORAL | Status: AC
  Filled 2021-10-17: qty 2

## 2021-10-17 MED ORDER — DEXAMETHASONE SODIUM PHOSPHATE 10 MG/ML IJ SOLN
INTRAMUSCULAR | Status: AC
  Filled 2021-10-17: qty 1

## 2021-10-17 MED ORDER — GABAPENTIN 300 MG PO CAPS
ORAL_CAPSULE | ORAL | Status: AC
  Filled 2021-10-17: qty 1

## 2021-10-17 MED ORDER — OXYCODONE HCL 5 MG PO TABS: 5.0000 mg | ORAL_TABLET | Freq: Once | ORAL | Status: DC | PRN

## 2021-10-17 MED ORDER — LIDOCAINE 2% (20 MG/ML) 5 ML SYRINGE
INTRAMUSCULAR | Status: AC
  Filled 2021-10-17: qty 5

## 2021-10-17 MED ORDER — MIDAZOLAM HCL 2 MG/2ML IJ SOLN
INTRAMUSCULAR | Status: AC
  Filled 2021-10-17: qty 2

## 2021-10-17 MED ORDER — SUCCINYLCHOLINE CHLORIDE 200 MG/10ML IV SOSY
PREFILLED_SYRINGE | INTRAVENOUS | Status: AC
  Filled 2021-10-17: qty 10

## 2021-10-17 MED ORDER — LACTATED RINGERS IV SOLN: INTRAVENOUS | Status: DC

## 2021-10-17 SURGICAL SUPPLY — 42 items
ADH SKN CLS APL DERMABOND .7 (GAUZE/BANDAGES/DRESSINGS) ×1
APL PRP STRL LF DISP 70% ISPRP (MISCELLANEOUS) ×1
APPLIER CLIP 9.375 MED OPEN (MISCELLANEOUS)
APR CLP MED 9.3 20 MLT OPN (MISCELLANEOUS)
BLADE SURG 15 STRL LF DISP TIS (BLADE) ×2 IMPLANT
BLADE SURG 15 STRL SS (BLADE) ×2
CANISTER SUC SOCK COL 7IN (MISCELLANEOUS) ×3 IMPLANT
CANISTER SUCT 1200ML W/VALVE (MISCELLANEOUS) ×3 IMPLANT
CHLORAPREP W/TINT 26 (MISCELLANEOUS) ×3 IMPLANT
CLIP APPLIE 9.375 MED OPEN (MISCELLANEOUS) IMPLANT
COVER BACK TABLE 60X90IN (DRAPES) ×3 IMPLANT
COVER MAYO STAND STRL (DRAPES) ×3 IMPLANT
COVER PROBE W GEL 5X96 (DRAPES) ×3 IMPLANT
DECANTER SPIKE VIAL GLASS SM (MISCELLANEOUS) IMPLANT
DERMABOND ADVANCED (GAUZE/BANDAGES/DRESSINGS) ×1
DERMABOND ADVANCED .7 DNX12 (GAUZE/BANDAGES/DRESSINGS) ×2 IMPLANT
DRAPE LAPAROSCOPIC ABDOMINAL (DRAPES) ×3 IMPLANT
DRAPE UTILITY XL STRL (DRAPES) ×3 IMPLANT
ELECT COATED BLADE 2.86 ST (ELECTRODE) ×3 IMPLANT
ELECT REM PT RETURN 9FT ADLT (ELECTROSURGICAL) ×2
ELECTRODE REM PT RTRN 9FT ADLT (ELECTROSURGICAL) ×2 IMPLANT
GLOVE SURG ENC MOIS LTX SZ7.5 (GLOVE) ×6 IMPLANT
GOWN STRL REUS W/ TWL LRG LVL3 (GOWN DISPOSABLE) ×4 IMPLANT
GOWN STRL REUS W/TWL LRG LVL3 (GOWN DISPOSABLE) ×4
ILLUMINATOR WAVEGUIDE N/F (MISCELLANEOUS) IMPLANT
KIT MARKER MARGIN INK (KITS) ×3 IMPLANT
LIGHT WAVEGUIDE WIDE FLAT (MISCELLANEOUS) IMPLANT
NDL HYPO 25X1 1.5 SAFETY (NEEDLE) IMPLANT
NEEDLE HYPO 25X1 1.5 SAFETY (NEEDLE) IMPLANT
NS IRRIG 1000ML POUR BTL (IV SOLUTION) IMPLANT
PACK BASIN DAY SURGERY FS (CUSTOM PROCEDURE TRAY) ×3 IMPLANT
PENCIL SMOKE EVACUATOR (MISCELLANEOUS) ×3 IMPLANT
SLEEVE SCD COMPRESS KNEE MED (STOCKING) ×3 IMPLANT
SPONGE T-LAP 18X18 ~~LOC~~+RFID (SPONGE) ×3 IMPLANT
SUT MON AB 4-0 PC3 18 (SUTURE) ×3 IMPLANT
SUT SILK 2 0 SH (SUTURE) IMPLANT
SUT VICRYL 3-0 CR8 SH (SUTURE) ×3 IMPLANT
SYR CONTROL 10ML LL (SYRINGE) IMPLANT
TOWEL GREEN STERILE FF (TOWEL DISPOSABLE) ×3 IMPLANT
TRAY FAXITRON CT DISP (TRAY / TRAY PROCEDURE) ×3 IMPLANT
TUBE CONNECTING 20X1/4 (TUBING) ×3 IMPLANT
YANKAUER SUCT BULB TIP NO VENT (SUCTIONS) IMPLANT

## 2021-10-17 NOTE — H&P (Signed)
REFERRING PHYSICIAN: Huntley Dec, MD  PROVIDER: Landry Corporal, MD  MRN: [id number] DOB: 1951-04-10 Subjective  Chief Complaint: New Consultation (Left Breast Mass)   History of Present Illness: Karen Shepard is a 71 y.o. female who is seen today as an office consultation at the request of Dr. Stann Mainland for evaluation of New Consultation (Left Breast Mass) .   We are asked to see the patient in consultation by Dr. Vania Rea to evaluate her for a mass in the left breast. The patient is a 71 year old white female who has had a mass in the inner aspect of the left breast for at least the last year. She had it evaluated again this year and was noted that it had gotten slightly larger. 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 She does have a family history of breast cancer in her sister. She does not smoke.  Review of Systems: A complete review of systems was obtained from the patient. I have reviewed this information and discussed as appropriate with the patient. See HPI as well for other ROS.  ROS   Medical History: Past Medical History:  Diagnosis Date   Arthritis   Hypertension   Patient Active Problem List  Diagnosis   Acute sinusitis, unspecified   Abdominal pain, generalized   Allergic rhinitis   Asthma   Cataract, nuclear sclerotic, right eye   Degenerative joint disease   Frozen shoulder   Gastroesophageal reflux disease   Hormone replacement therapy (HRT)   Hypertension   Influenza   Irritable bowel syndrome   Menopausal symptom   Menopausal syndrome   Neck pain   Primary insomnia   Mass of upper inner quadrant of left breast   Past Surgical History:  Procedure Laterality Date   Breast Surgery  Fibroid   CATARACT EXTRACTION Right 08/25/2021   Shoulder Surgery    Allergies  Allergen Reactions   Codeine Itching  REACTION:  itching REACTION: itching REACTION: itching   Sulfa (Sulfonamide Antibiotics) Other (See Comments) and Rash  REACTION: swelling   Penicillins Rash  REACTION: causes severe swelling   Zolpidem Other (See Comments)  Depression   Current Outpatient Medications on File Prior to Visit  Medication Sig Dispense Refill   atenoloL-chlorthalidone (TENORETIC) 50-25 mg tablet atenolol 50 mg-chlorthalidone 25 mg tablet   estradioL (ESTRACE) 1 MG tablet Take by mouth   traZODone (DESYREL) 100 MG tablet Take by mouth   No current facility-administered medications on file prior to visit.   History reviewed. No pertinent family history.   Social History   Tobacco Use  Smoking Status Former   Types: Cigarettes   Quit date: 1973   Years since quitting: 49.9  Smokeless Tobacco Never    Social History   Socioeconomic History   Marital status: Married  Tobacco Use   Smoking status: Former  Types: Cigarettes  Quit date: 1973  Years since quitting: 49.9   Smokeless tobacco: Never  Vaping Use   Vaping Use: Never used  Substance and Sexual Activity   Alcohol use: Yes   Drug use: Never   Objective:   Vitals:  BP: 120/60  Pulse: 64  Temp: 36.4 C (97.6 F)  SpO2: 97%  Weight: 64 kg (141 lb 3.2 oz)  Height: 162.6 cm (5\' 4" )   Body mass index is 24.24 kg/m.  Physical Exam Vitals reviewed.  Constitutional:  General: She is  not in acute distress. Appearance: Normal appearance.  HENT:  Head: Normocephalic and atraumatic.  Right Ear: External ear normal.  Left Ear: External ear normal.  Nose: Nose normal.  Mouth/Throat:  Mouth: Mucous membranes are moist.  Pharynx: Oropharynx is clear.  Eyes:  General: No scleral icterus. Extraocular Movements: Extraocular movements intact.  Conjunctiva/sclera: Conjunctivae normal.  Pupils: Pupils are equal, round, and reactive to light.  Cardiovascular:  Rate and Rhythm: Normal rate and regular rhythm.  Pulses: Normal pulses.  Heart  sounds: Normal heart sounds.  Pulmonary:  Effort: Pulmonary effort is normal. No respiratory distress.  Breath sounds: Normal breath sounds.  Abdominal:  General: Bowel sounds are normal.  Palpations: Abdomen is soft.  Tenderness: There is no abdominal tenderness.  Musculoskeletal:  General: No swelling, tenderness or deformity. Normal range of motion.  Cervical back: Normal range of motion and neck supple.  Skin: General: Skin is warm and dry.  Coloration: Skin is not jaundiced.  Neurological:  General: No focal deficit present.  Mental Status: She is alert and oriented to person, place, and time.  Psychiatric:  Mood and Affect: Mood normal.  Behavior: Behavior normal.     Breast: There is a faint palpable mass in the inner aspect of the left breast. Other than this there is no other palpable mass in either breast. There is no palpable axillary, supraclavicular, or cervical lymphadenopathy.  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 Because of its growth and because the pathologist cannot tell the difference between a fibroadenoma and a possible phylloides tumor I would recommend that this area be removed. She would also like to have this done. I have discussed with her in detail the risks and benefits of the operation as well as some of the technical aspects including the use of a radioactive seed for localization and she understands and wishes to proceed. She also feels that there may be a mass in the lateral left breast which was not looked at by ultrasound. We will have the ultrasound repeated before surgery

## 2021-10-17 NOTE — Anesthesia Procedure Notes (Signed)
Procedure Name: LMA Insertion Date/Time: 10/17/2021 11:56 AM Performed by: Willa Frater, CRNA Pre-anesthesia Checklist: Patient identified, Emergency Drugs available, Suction available and Patient being monitored Patient Re-evaluated:Patient Re-evaluated prior to induction Oxygen Delivery Method: Circle system utilized Preoxygenation: Pre-oxygenation with 100% oxygen Induction Type: IV induction Ventilation: Mask ventilation without difficulty LMA: LMA inserted LMA Size: 4.0 Number of attempts: 1 Airway Equipment and Method: Bite block Placement Confirmation: positive ETCO2 Tube secured with: Tape Dental Injury: Teeth and Oropharynx as per pre-operative assessment

## 2021-10-17 NOTE — Interval H&P Note (Signed)
History and Physical Interval Note:  10/17/2021 11:31 AM  Karen Shepard  has presented today for surgery, with the diagnosis of LEFT BREAST FIBROEPITHELIAL LESION.  The various methods of treatment have been discussed with the patient and family. After consideration of risks, benefits and other options for treatment, the patient has consented to  Procedure(s): LEFT BREAST LUMPECTOMY WITH RADIOACTIVE SEED LOCALIZATION (Left) as a surgical intervention.  The patient's history has been reviewed, patient examined, no change in status, stable for surgery.  I have reviewed the patient's chart and labs.  Questions were answered to the patient's satisfaction.     Autumn Messing III

## 2021-10-17 NOTE — Op Note (Signed)
10/17/2021  12:39 PM  PATIENT:  Karen Shepard  71 y.o. female  PRE-OPERATIVE DIAGNOSIS:  LEFT BREAST FIBROEPITHELIAL LESION  POST-OPERATIVE DIAGNOSIS:  LEFT BREAST FIBROEPITHELIAL LESION  PROCEDURE:  Procedure(s): LEFT BREAST LUMPECTOMY WITH RADIOACTIVE SEED LOCALIZATION (Left)  SURGEON:  Surgeon(s) and Role:    * Jovita Kussmaul, MD - Primary  PHYSICIAN ASSISTANT:   ASSISTANTS: none   ANESTHESIA:   local and general  EBL:  minimal   BLOOD ADMINISTERED:none  DRAINS: none   LOCAL MEDICATIONS USED:  MARCAINE     SPECIMEN:  Source of Specimen:  left breast tissue  DISPOSITION OF SPECIMEN:  PATHOLOGY  COUNTS:  YES  TOURNIQUET:  * No tourniquets in log *  DICTATION: .Dragon Dictation  After informed consent was obtained the patient was brought to the operating room and placed in the supine position on the operating table.  After adequate induction of general anesthesia the patient's left breast was prepped with ChloraPrep, allowed to dry, and draped in usual sterile manner.  An appropriate timeout was performed.  Previously an I-125 seed was placed in the inner aspect of the left breast to mark area of fibroepithelial lesion.  The neoprobe was set to I-125 in the area of radioactivity was readily identified.  The area around this was infiltrated with quarter percent Marcaine.  A curvilinear incision was made along the inner edge of the areola of the left breast with a 15 blade knife.  The incision was carried through the skin and subcutaneous tissue sharply with the electrocautery.  Dissection was then carried throughout the inner aspect of the left breast between the breast tissue and the subcutaneous fat and skin.  Once this dissection was well beyond the area of the radioactivity I then removed a circular portion of breast tissue sharply with the electrocautery around the radioactive seed while checking the area of radioactivity frequently.  Once the specimen was removed it was  oriented with the appropriate paint colors.  A specimen radiograph was obtained that showed the clip and seed to be near the center of the specimen.  The specimen was then sent to pathology for further evaluation.  Hemostasis was achieved using the Bovie electrocautery.  The wound was irrigated with saline and infiltrated with more quarter percent Marcaine.  The deep layer of the incision was then closed with interrupted 3-0 Vicryl stitches.  The skin was then closed with interrupted 4-0 Monocryl subcuticular stitches.  Dermabond dressings were applied.  The patient tolerated the procedure well.  At the end of the case all needle sponge and instrument counts were correct.  The patient was then awakened and taken to recovery in stable condition.  PLAN OF CARE: Discharge to home after PACU  PATIENT DISPOSITION:  PACU - hemodynamically stable.   Delay start of Pharmacological VTE agent (>24hrs) due to surgical blood loss or risk of bleeding: not applicable

## 2021-10-17 NOTE — Anesthesia Postprocedure Evaluation (Signed)
Anesthesia Post Note  Patient: Karen Shepard  Procedure(s) Performed: LEFT BREAST LUMPECTOMY WITH RADIOACTIVE SEED LOCALIZATION (Left: Breast)     Patient location during evaluation: PACU Anesthesia Type: General Level of consciousness: sedated and patient cooperative Pain management: pain level controlled Vital Signs Assessment: post-procedure vital signs reviewed and stable Respiratory status: spontaneous breathing Cardiovascular status: stable Anesthetic complications: no   No notable events documented.  Last Vitals:  Vitals:   10/17/21 1330 10/17/21 1335  BP: (!) 114/59 (!) 140/57  Pulse: 61 (!) 59  Resp: 18 15  Temp:  36.5 C  SpO2: 96% 96%    Last Pain:  Vitals:   10/17/21 1335  TempSrc:   PainSc: 0-No pain                 Nolon Nations

## 2021-10-17 NOTE — Transfer of Care (Signed)
Immediate Anesthesia Transfer of Care Note  Patient: Karen Shepard  Procedure(s) Performed: LEFT BREAST LUMPECTOMY WITH RADIOACTIVE SEED LOCALIZATION (Left: Breast)  Patient Location: PACU  Anesthesia Type:General  Level of Consciousness: sedated  Airway & Oxygen Therapy: Patient Spontanous Breathing and Patient connected to face mask oxygen  Post-op Assessment: Report given to RN and Post -op Vital signs reviewed and stable  Post vital signs: Reviewed and stable  Last Vitals:  Vitals Value Taken Time  BP    Temp    Pulse 62 10/17/21 1248  Resp    SpO2 90 % 10/17/21 1248  Vitals shown include unvalidated device data.  Last Pain:  Vitals:   10/17/21 1105  TempSrc: Oral  PainSc: 0-No pain      Patients Stated Pain Goal: 7 (79/55/83 1674)  Complications: No notable events documented.

## 2021-10-17 NOTE — Discharge Instructions (Addendum)
°  Post Anesthesia Home Care Instructions  Activity: Get plenty of rest for the remainder of the day. A responsible individual must stay with you for 24 hours following the procedure.  For the next 24 hours, DO NOT: -Drive a car -Paediatric nurse -Drink alcoholic beverages -Take any medication unless instructed by your physician -Make any legal decisions or sign important papers.  Meals: Start with liquid foods such as gelatin or soup. Progress to regular foods as tolerated. Avoid greasy, spicy, heavy foods. If nausea and/or vomiting occur, drink only clear liquids until the nausea and/or vomiting subsides. Call your physician if vomiting continues.  Special Instructions/Symptoms: Your throat may feel dry or sore from the anesthesia or the breathing tube placed in your throat during surgery. If this causes discomfort, gargle with warm salt water. The discomfort should disappear within 24 hours.  If you had a scopolamine patch placed behind your ear for the management of post- operative nausea and/or vomiting:  1. The medication in the patch is effective for 72 hours, after which it should be removed.  Wrap patch in a tissue and discard in the trash. Wash hands thoroughly with soap and water. 2. You may remove the patch earlier than 72 hours if you experience unpleasant side effects which may include dry mouth, dizziness or visual disturbances. 3. Avoid touching the patch. Wash your hands with soap and water after contact with the patch.     No additional tylenol until AFTER 3:30PM

## 2021-10-17 NOTE — Anesthesia Preprocedure Evaluation (Addendum)
Anesthesia Evaluation  Patient identified by MRN, date of birth, ID band Patient awake    Reviewed: Allergy & Precautions, NPO status , Patient's Chart, lab work & pertinent test results  Airway Mallampati: II  TM Distance: >3 FB Neck ROM: Full    Dental no notable dental hx. (+) Dental Advisory Given, Teeth Intact   Pulmonary asthma , former smoker,    Pulmonary exam normal breath sounds clear to auscultation       Cardiovascular hypertension, Pt. on medications Normal cardiovascular exam Rhythm:Regular Rate:Normal     Neuro/Psych negative neurological ROS     GI/Hepatic negative GI ROS, Neg liver ROS,   Endo/Other  negative endocrine ROS  Renal/GU negative Renal ROS     Musculoskeletal  (+) Arthritis ,   Abdominal   Peds  Hematology negative hematology ROS (+)   Anesthesia Other Findings   Reproductive/Obstetrics                           Anesthesia Physical Anesthesia Plan  ASA: 2  Anesthesia Plan: General   Post-op Pain Management: Minimal or no pain anticipated, Tylenol PO (pre-op) and Gabapentin PO (pre-op)   Induction: Intravenous  PONV Risk Score and Plan: 4 or greater and Ondansetron, Dexamethasone, Treatment may vary due to age or medical condition, Midazolam and Diphenhydramine  Airway Management Planned: LMA  Additional Equipment: None  Intra-op Plan:   Post-operative Plan: Extubation in OR  Informed Consent: I have reviewed the patients History and Physical, chart, labs and discussed the procedure including the risks, benefits and alternatives for the proposed anesthesia with the patient or authorized representative who has indicated his/her understanding and acceptance.     Dental advisory given  Plan Discussed with: CRNA  Anesthesia Plan Comments:        Anesthesia Quick Evaluation

## 2021-10-20 ENCOUNTER — Encounter (HOSPITAL_BASED_OUTPATIENT_CLINIC_OR_DEPARTMENT_OTHER): Payer: Self-pay | Admitting: General Surgery

## 2021-10-20 LAB — SURGICAL PATHOLOGY

## 2021-11-27 ENCOUNTER — Encounter (HOSPITAL_COMMUNITY): Payer: Self-pay

## 2022-02-03 ENCOUNTER — Other Ambulatory Visit: Payer: Self-pay | Admitting: Obstetrics & Gynecology

## 2022-02-03 DIAGNOSIS — Z9889 Other specified postprocedural states: Secondary | ICD-10-CM

## 2022-02-13 ENCOUNTER — Ambulatory Visit
Admission: RE | Admit: 2022-02-13 | Discharge: 2022-02-13 | Disposition: A | Discharge status: Home / Self Care | Payer: Medicare HMO | Source: Ambulatory Visit | Attending: Obstetrics & Gynecology | Admitting: Obstetrics & Gynecology

## 2022-02-13 ENCOUNTER — Ambulatory Visit: Payer: Medicare HMO

## 2022-02-13 DIAGNOSIS — Z9889 Other specified postprocedural states: Secondary | ICD-10-CM

## 2023-01-13 ENCOUNTER — Other Ambulatory Visit: Payer: Self-pay | Admitting: Obstetrics & Gynecology

## 2023-01-13 DIAGNOSIS — Z1231 Encounter for screening mammogram for malignant neoplasm of breast: Secondary | ICD-10-CM

## 2023-02-23 ENCOUNTER — Ambulatory Visit
Admission: RE | Admit: 2023-02-23 | Discharge: 2023-02-23 | Disposition: A | Discharge status: Home / Self Care | Payer: Medicare HMO | Source: Ambulatory Visit | Attending: Obstetrics & Gynecology | Admitting: Obstetrics & Gynecology

## 2023-02-23 DIAGNOSIS — Z1231 Encounter for screening mammogram for malignant neoplasm of breast: Secondary | ICD-10-CM

## 2023-08-04 LAB — COLOGUARD: COLOGUARD: NEGATIVE

## 2023-10-25 IMAGING — MG DIGITAL DIAGNOSTIC BILAT W/ TOMO W/ CAD
8 series · 9 of 24 positions shown · non-contrast
Comparison: Previous exam(s).

CLINICAL DATA: 70-year-old female presenting for annual exam.
Patient has intermittent diffuse tenderness in the left breast.
Recent surgical excision for left breast fibroadenoma.

EXAM:
DIGITAL DIAGNOSTIC BILATERAL MAMMOGRAM WITH TOMOSYNTHESIS AND CAD
TECHNIQUE: Bilateral digital diagnostic mammography and breast tomosynthesis
was performed. The images were evaluated with computer-aided
detection.

[R CC synth-2D]
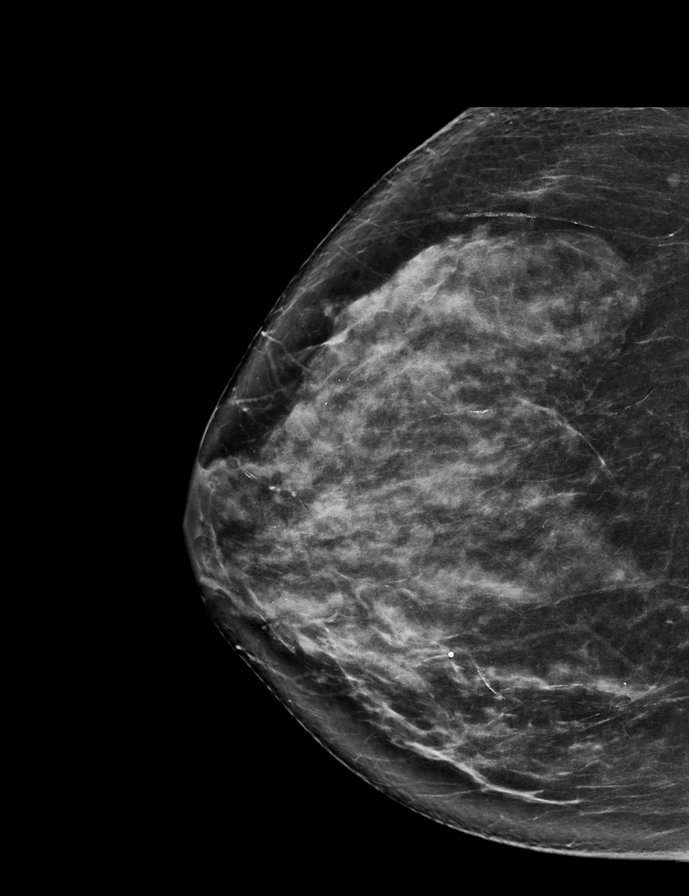

[R MLO synth-2D]
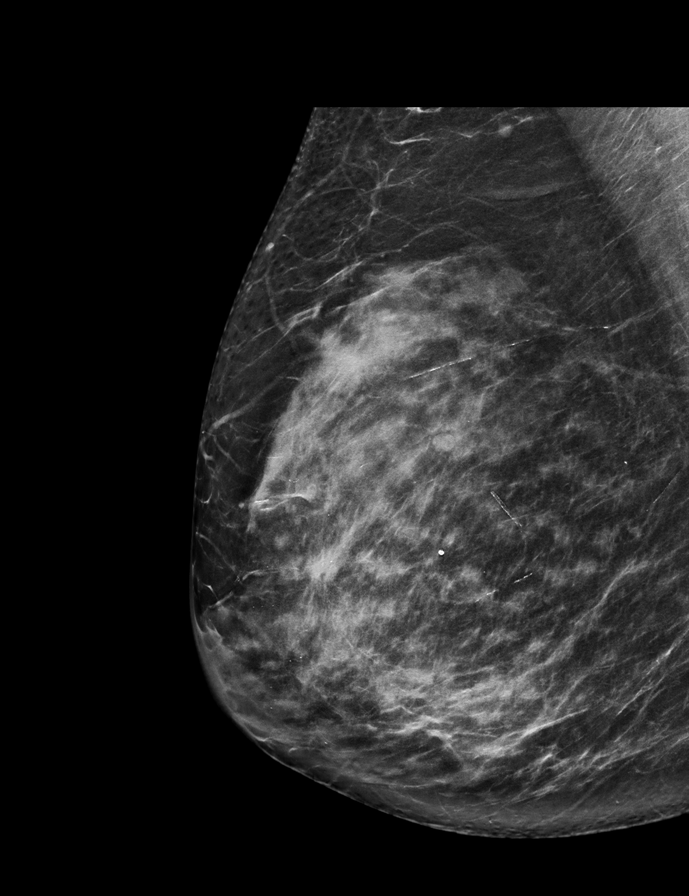

[L CC synth-2D]
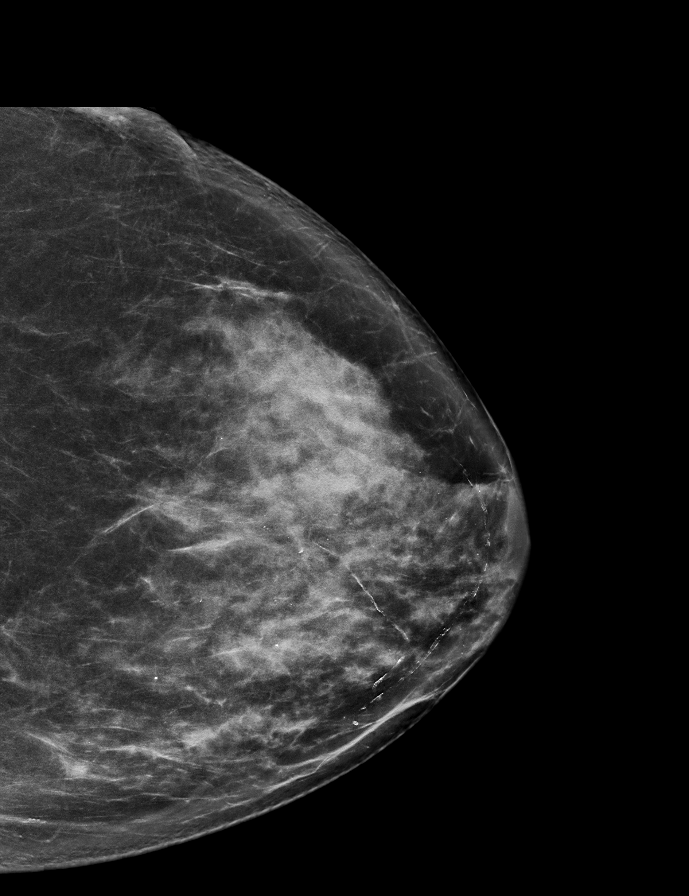

[L MLO synth-2D]
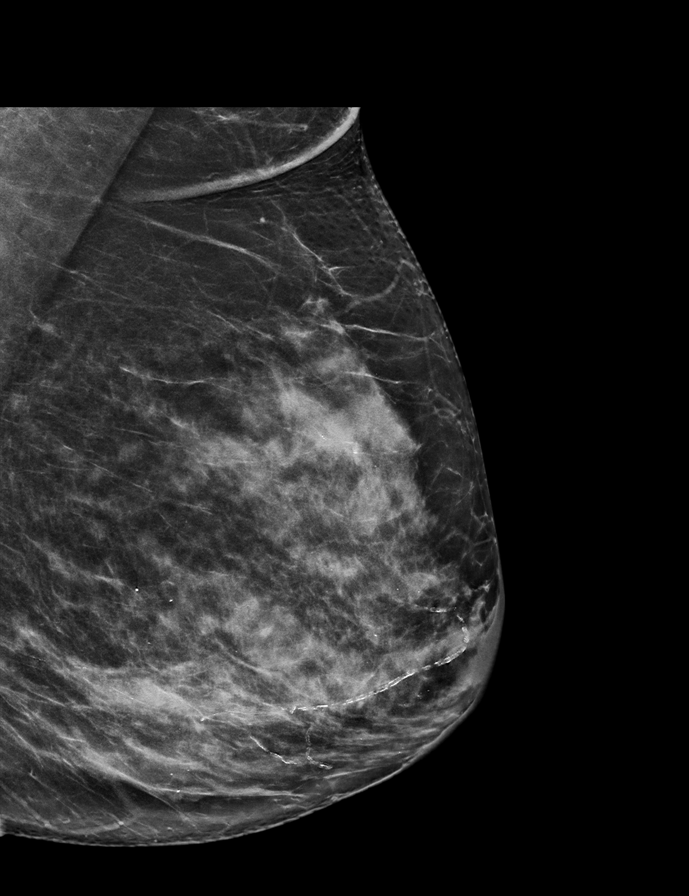

[L MLO tomo · 2 of 72 frames shown]
[frame 24/72]
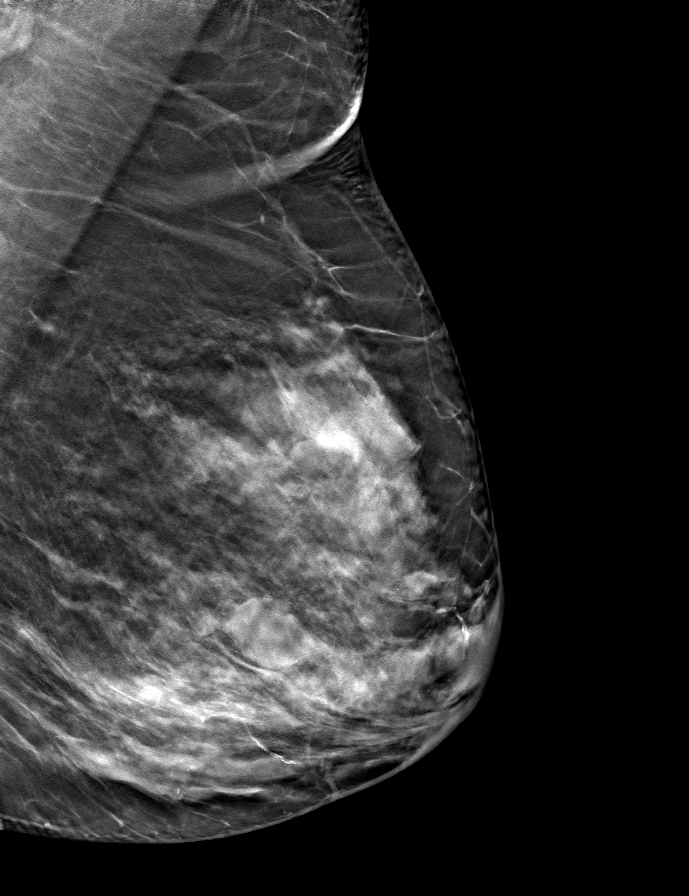
[frame 37/72]
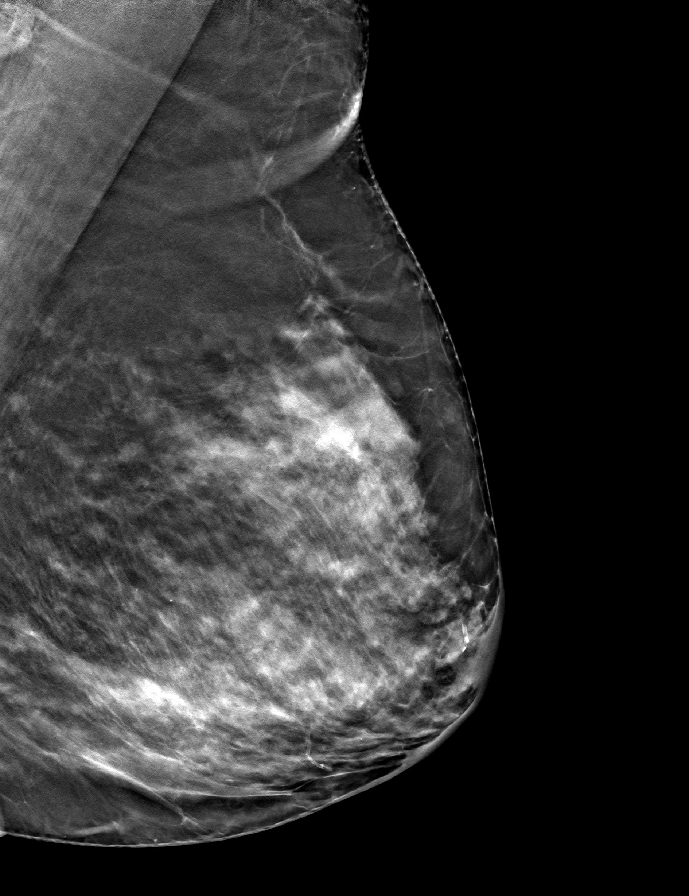

[R MLO tomo · tomo slice 39/77.0]
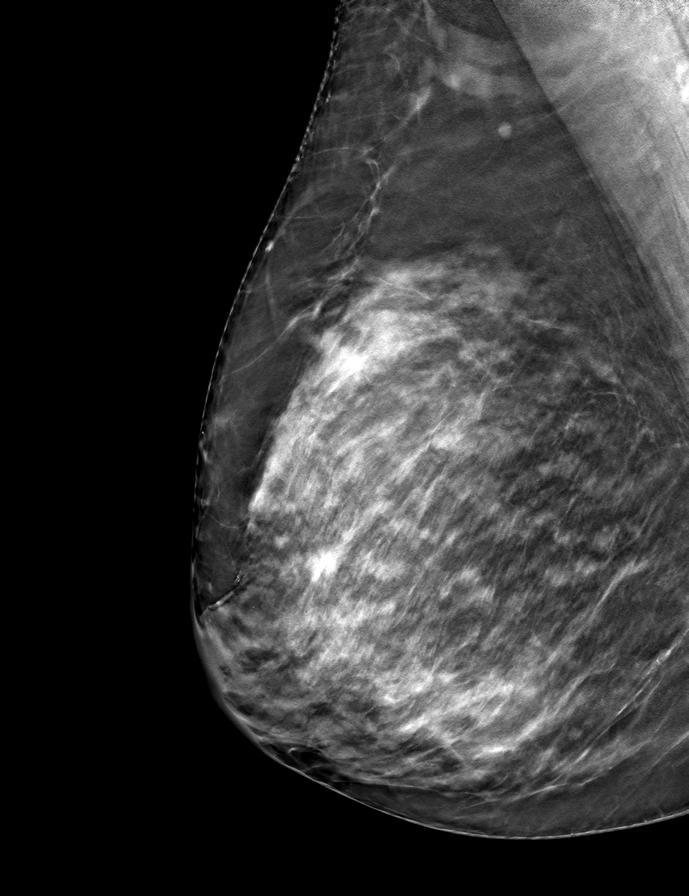

[R CC tomo · tomo slice 40/79.0]
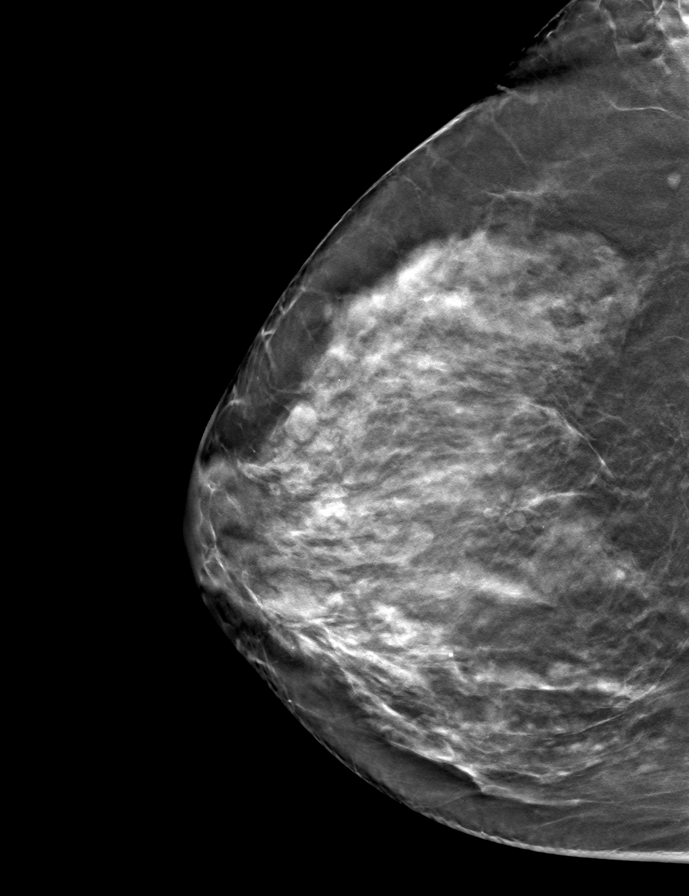

[L CC tomo · tomo slice 43/84.0]
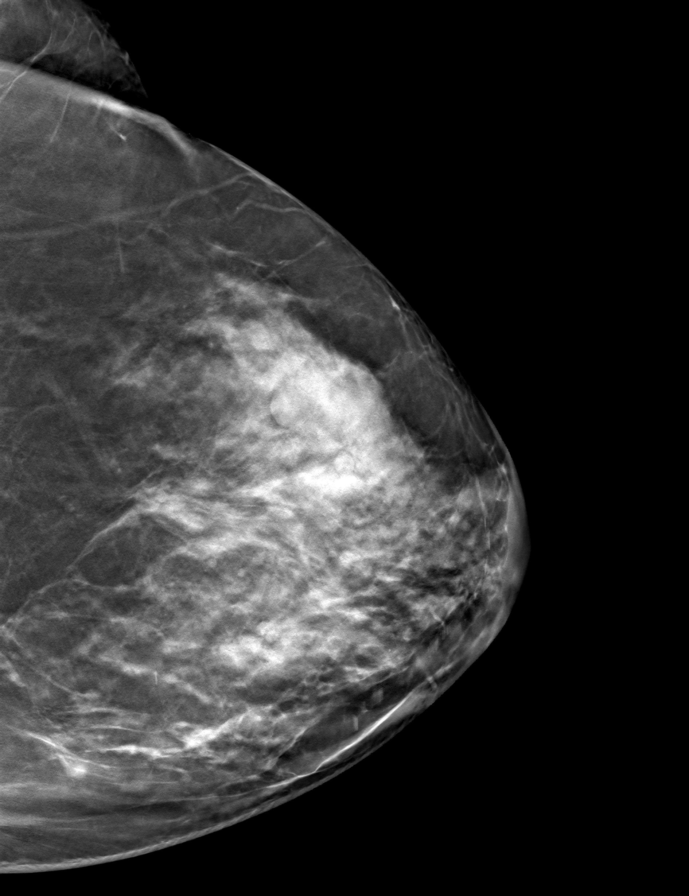

[9 of 24 positions shown; findings below may reference images not displayed]

ACR Breast Density Category c: The breast tissue is heterogeneously
dense, which may obscure small masses.
FINDINGS: Right breast: No suspicious mass, distortion, or microcalcifications
are identified to suggest presence of malignancy.

Left breast: No suspicious mass, distortion, or microcalcifications
are identified to suggest presence of malignancy.
IMPRESSION: No mammographic evidence of malignancy bilaterally.

RECOMMENDATION:
Screening mammogram in one year.(Code:[id number])

I have discussed the findings and recommendations with the patient.
If applicable, a reminder letter will be sent to the patient
regarding the next appointment.

BI-RADS CATEGORY  1: Negative.

## 2024-01-13 ENCOUNTER — Other Ambulatory Visit: Payer: Self-pay | Admitting: Obstetrics & Gynecology

## 2024-01-13 DIAGNOSIS — Z1231 Encounter for screening mammogram for malignant neoplasm of breast: Secondary | ICD-10-CM

## 2024-03-06 ENCOUNTER — Ambulatory Visit
Admission: RE | Admit: 2024-03-06 | Discharge: 2024-03-06 | Disposition: A | Discharge status: Home / Self Care | Source: Ambulatory Visit | Attending: Obstetrics & Gynecology | Admitting: Obstetrics & Gynecology

## 2024-03-06 DIAGNOSIS — Z1231 Encounter for screening mammogram for malignant neoplasm of breast: Secondary | ICD-10-CM
